# Patient Record
Sex: Female | Born: 1950 | Race: White | Hispanic: No | Marital: Married | Smoking: Former smoker
Health system: Southern US, Community
[De-identification: ages and names within clinical notes are randomized; demographics above are authoritative.]

## PROBLEM LIST (undated history)

## (undated) DIAGNOSIS — I10 Essential (primary) hypertension: Secondary | ICD-10-CM

## (undated) DIAGNOSIS — F32A Depression, unspecified: Secondary | ICD-10-CM

## (undated) DIAGNOSIS — F329 Major depressive disorder, single episode, unspecified: Secondary | ICD-10-CM

## (undated) DIAGNOSIS — M545 Low back pain, unspecified: Secondary | ICD-10-CM

## (undated) DIAGNOSIS — E785 Hyperlipidemia, unspecified: Secondary | ICD-10-CM

## (undated) HISTORY — DX: Essential (primary) hypertension: I10

## (undated) HISTORY — DX: Depression, unspecified: F32.A

## (undated) HISTORY — DX: Low back pain, unspecified: M54.50

## (undated) HISTORY — DX: Hyperlipidemia, unspecified: E78.5

## (undated) HISTORY — DX: Major depressive disorder, single episode, unspecified: F32.9

## (undated) HISTORY — DX: Low back pain: M54.5

## (undated) HISTORY — PX: BREAST BIOPSY: SHX20

---

## 1995-07-11 HISTORY — PX: LUMBAR FUSION: SHX111

## 2006-04-25 ENCOUNTER — Other Ambulatory Visit: Admission: RE | Admit: 2006-04-25 | Discharge: 2006-04-25 | Payer: Self-pay | Admitting: Obstetrics and Gynecology

## 2006-04-30 ENCOUNTER — Encounter: Payer: Self-pay | Admitting: Internal Medicine

## 2006-05-07 ENCOUNTER — Ambulatory Visit: Payer: Self-pay | Admitting: Internal Medicine

## 2006-05-28 ENCOUNTER — Ambulatory Visit: Payer: Self-pay | Admitting: Internal Medicine

## 2006-05-28 LAB — CONVERTED CEMR LAB
Basophils Relative: 0.4 % (ref 0.0–1.0)
Cholesterol: 204 mg/dL (ref 0–200)
Eosinophil percent: 7.3 % — ABNORMAL HIGH (ref 0.0–5.0)
GFR calc non Af Amer: 69 mL/min
Glomerular Filtration Rate, Af Am: 84 mL/min/{1.73_m2}
Glucose, Bld: 117 mg/dL — ABNORMAL HIGH (ref 70–99)
Hemoglobin: 14.1 g/dL (ref 12.0–15.0)
Lymphocytes Relative: 33.2 % (ref 12.0–46.0)
Monocytes Absolute: 0.3 10*3/uL (ref 0.2–0.7)
Neutro Abs: 2.3 10*3/uL (ref 1.4–7.7)
Sodium: 139 meq/L (ref 135–145)
VLDL: 20 mg/dL (ref 0–40)
WBC: 4.4 10*3/uL — ABNORMAL LOW (ref 4.5–10.5)

## 2006-06-21 ENCOUNTER — Ambulatory Visit: Payer: Self-pay | Admitting: Internal Medicine

## 2006-07-12 ENCOUNTER — Ambulatory Visit: Payer: Self-pay | Admitting: Physical Medicine & Rehabilitation

## 2006-07-12 ENCOUNTER — Encounter
Admission: RE | Admit: 2006-07-12 | Discharge: 2006-10-10 | Payer: Self-pay | Admitting: Physical Medicine & Rehabilitation

## 2006-07-27 ENCOUNTER — Ambulatory Visit: Payer: Self-pay | Admitting: Internal Medicine

## 2006-07-31 ENCOUNTER — Ambulatory Visit: Payer: Self-pay | Admitting: Internal Medicine

## 2006-08-28 ENCOUNTER — Ambulatory Visit: Payer: Self-pay | Admitting: Physical Medicine & Rehabilitation

## 2006-09-06 ENCOUNTER — Ambulatory Visit: Payer: Self-pay | Admitting: Physical Medicine and Rehabilitation

## 2006-10-10 ENCOUNTER — Encounter
Admission: RE | Admit: 2006-10-10 | Discharge: 2007-01-08 | Payer: Self-pay | Admitting: Physical Medicine & Rehabilitation

## 2006-10-18 ENCOUNTER — Encounter
Admission: RE | Admit: 2006-10-18 | Discharge: 2007-01-16 | Payer: Self-pay | Admitting: Physical Medicine & Rehabilitation

## 2006-10-23 ENCOUNTER — Ambulatory Visit: Payer: Self-pay | Admitting: Physical Medicine & Rehabilitation

## 2006-10-29 ENCOUNTER — Ambulatory Visit: Payer: Self-pay | Admitting: Internal Medicine

## 2006-10-29 LAB — CONVERTED CEMR LAB
Folate: 18.2 ng/mL
T3, Free: 2.9 pg/mL (ref 2.3–4.2)
TSH: 0.74 microintl units/mL (ref 0.35–5.50)
Vitamin B-12: 379 pg/mL (ref 211–911)

## 2006-12-31 ENCOUNTER — Ambulatory Visit: Payer: Self-pay | Admitting: Internal Medicine

## 2007-01-08 ENCOUNTER — Encounter
Admission: RE | Admit: 2007-01-08 | Discharge: 2007-04-08 | Payer: Self-pay | Admitting: Physical Medicine & Rehabilitation

## 2007-01-14 ENCOUNTER — Emergency Department (HOSPITAL_COMMUNITY): Admission: EM | Admit: 2007-01-14 | Discharge: 2007-01-14 | Payer: Self-pay | Admitting: Family Medicine

## 2007-01-15 ENCOUNTER — Ambulatory Visit: Payer: Self-pay | Admitting: Physical Medicine & Rehabilitation

## 2007-02-08 ENCOUNTER — Encounter
Admission: RE | Admit: 2007-02-08 | Discharge: 2007-05-09 | Payer: Self-pay | Admitting: Physical Medicine and Rehabilitation

## 2007-02-08 ENCOUNTER — Ambulatory Visit: Payer: Self-pay | Admitting: Physical Medicine and Rehabilitation

## 2007-03-12 ENCOUNTER — Ambulatory Visit: Payer: Self-pay | Admitting: Physical Medicine & Rehabilitation

## 2007-03-26 ENCOUNTER — Emergency Department (HOSPITAL_COMMUNITY): Admission: EM | Admit: 2007-03-26 | Discharge: 2007-03-26 | Payer: Self-pay | Admitting: Emergency Medicine

## 2007-05-06 ENCOUNTER — Encounter
Admission: RE | Admit: 2007-05-06 | Discharge: 2007-08-04 | Payer: Self-pay | Admitting: Physical Medicine & Rehabilitation

## 2007-05-06 ENCOUNTER — Ambulatory Visit: Payer: Self-pay | Admitting: Physical Medicine & Rehabilitation

## 2007-07-01 ENCOUNTER — Ambulatory Visit: Payer: Self-pay | Admitting: Physical Medicine & Rehabilitation

## 2007-07-10 ENCOUNTER — Encounter: Payer: Self-pay | Admitting: Internal Medicine

## 2007-07-10 DIAGNOSIS — F1910 Other psychoactive substance abuse, uncomplicated: Secondary | ICD-10-CM | POA: Insufficient documentation

## 2007-07-10 DIAGNOSIS — F341 Dysthymic disorder: Secondary | ICD-10-CM

## 2007-07-10 DIAGNOSIS — I10 Essential (primary) hypertension: Secondary | ICD-10-CM | POA: Insufficient documentation

## 2007-07-10 DIAGNOSIS — D649 Anemia, unspecified: Secondary | ICD-10-CM

## 2007-07-10 DIAGNOSIS — E785 Hyperlipidemia, unspecified: Secondary | ICD-10-CM

## 2007-07-10 DIAGNOSIS — N949 Unspecified condition associated with female genital organs and menstrual cycle: Secondary | ICD-10-CM

## 2007-07-25 ENCOUNTER — Ambulatory Visit: Payer: Self-pay | Admitting: Physical Medicine & Rehabilitation

## 2007-07-25 ENCOUNTER — Encounter
Admission: RE | Admit: 2007-07-25 | Discharge: 2007-10-23 | Payer: Self-pay | Admitting: Physical Medicine & Rehabilitation

## 2007-08-26 ENCOUNTER — Ambulatory Visit: Payer: Self-pay | Admitting: Physical Medicine & Rehabilitation

## 2007-10-22 ENCOUNTER — Ambulatory Visit: Payer: Self-pay | Admitting: Physical Medicine & Rehabilitation

## 2007-11-19 ENCOUNTER — Ambulatory Visit: Payer: Self-pay | Admitting: Physical Medicine & Rehabilitation

## 2007-11-19 ENCOUNTER — Encounter
Admission: RE | Admit: 2007-11-19 | Discharge: 2008-02-17 | Payer: Self-pay | Admitting: Physical Medicine & Rehabilitation

## 2007-12-12 ENCOUNTER — Emergency Department (HOSPITAL_COMMUNITY): Admission: EM | Admit: 2007-12-12 | Discharge: 2007-12-12 | Payer: Self-pay | Admitting: Emergency Medicine

## 2007-12-18 ENCOUNTER — Ambulatory Visit: Payer: Self-pay | Admitting: Physical Medicine & Rehabilitation

## 2008-01-16 ENCOUNTER — Ambulatory Visit: Payer: Self-pay | Admitting: Physical Medicine & Rehabilitation

## 2008-02-17 ENCOUNTER — Ambulatory Visit: Payer: Self-pay | Admitting: Physical Medicine & Rehabilitation

## 2008-03-13 ENCOUNTER — Encounter
Admission: RE | Admit: 2008-03-13 | Discharge: 2008-06-11 | Payer: Self-pay | Admitting: Physical Medicine & Rehabilitation

## 2008-03-13 ENCOUNTER — Ambulatory Visit (HOSPITAL_COMMUNITY)
Admission: RE | Admit: 2008-03-13 | Discharge: 2008-03-13 | Payer: Self-pay | Admitting: Physical Medicine & Rehabilitation

## 2008-03-17 ENCOUNTER — Ambulatory Visit: Payer: Self-pay | Admitting: Physical Medicine & Rehabilitation

## 2008-03-25 ENCOUNTER — Ambulatory Visit: Payer: Self-pay | Admitting: Internal Medicine

## 2008-03-25 ENCOUNTER — Encounter: Payer: Self-pay | Admitting: Internal Medicine

## 2008-03-25 DIAGNOSIS — M545 Low back pain: Secondary | ICD-10-CM

## 2008-03-25 DIAGNOSIS — F329 Major depressive disorder, single episode, unspecified: Secondary | ICD-10-CM

## 2008-04-14 ENCOUNTER — Ambulatory Visit: Payer: Self-pay | Admitting: Physical Medicine & Rehabilitation

## 2008-05-12 ENCOUNTER — Ambulatory Visit: Payer: Self-pay | Admitting: Physical Medicine & Rehabilitation

## 2008-05-26 ENCOUNTER — Ambulatory Visit: Payer: Self-pay | Admitting: Internal Medicine

## 2008-05-27 ENCOUNTER — Encounter: Payer: Self-pay | Admitting: Internal Medicine

## 2008-06-03 ENCOUNTER — Ambulatory Visit: Payer: Self-pay | Admitting: Internal Medicine

## 2008-06-08 ENCOUNTER — Encounter
Admission: RE | Admit: 2008-06-08 | Discharge: 2008-09-06 | Payer: Self-pay | Admitting: Physical Medicine & Rehabilitation

## 2008-06-09 ENCOUNTER — Ambulatory Visit: Payer: Self-pay | Admitting: Physical Medicine & Rehabilitation

## 2008-06-17 ENCOUNTER — Telehealth: Payer: Self-pay | Admitting: Internal Medicine

## 2008-06-17 ENCOUNTER — Ambulatory Visit: Payer: Self-pay | Admitting: Internal Medicine

## 2008-06-17 DIAGNOSIS — R519 Headache, unspecified: Secondary | ICD-10-CM | POA: Insufficient documentation

## 2008-06-17 DIAGNOSIS — R51 Headache: Secondary | ICD-10-CM

## 2008-06-18 ENCOUNTER — Encounter: Payer: Self-pay | Admitting: Internal Medicine

## 2008-07-07 ENCOUNTER — Ambulatory Visit: Payer: Self-pay | Admitting: Physical Medicine & Rehabilitation

## 2008-07-15 ENCOUNTER — Ambulatory Visit: Payer: Self-pay | Admitting: Internal Medicine

## 2008-08-03 ENCOUNTER — Ambulatory Visit: Payer: Self-pay | Admitting: Physical Medicine & Rehabilitation

## 2008-09-01 ENCOUNTER — Ambulatory Visit: Payer: Self-pay | Admitting: Physical Medicine & Rehabilitation

## 2008-10-07 ENCOUNTER — Encounter
Admission: RE | Admit: 2008-10-07 | Discharge: 2008-12-31 | Payer: Self-pay | Admitting: Physical Medicine & Rehabilitation

## 2008-10-13 ENCOUNTER — Ambulatory Visit: Payer: Self-pay | Admitting: Physical Medicine & Rehabilitation

## 2008-10-22 ENCOUNTER — Ambulatory Visit: Payer: Self-pay | Admitting: Internal Medicine

## 2008-11-10 ENCOUNTER — Ambulatory Visit: Payer: Self-pay | Admitting: Physical Medicine & Rehabilitation

## 2008-12-09 ENCOUNTER — Ambulatory Visit: Payer: Self-pay | Admitting: Physical Medicine & Rehabilitation

## 2008-12-31 ENCOUNTER — Encounter
Admission: RE | Admit: 2008-12-31 | Discharge: 2009-03-31 | Payer: Self-pay | Admitting: Physical Medicine & Rehabilitation

## 2009-01-06 ENCOUNTER — Ambulatory Visit: Payer: Self-pay | Admitting: Physical Medicine & Rehabilitation

## 2009-02-04 ENCOUNTER — Ambulatory Visit: Payer: Self-pay | Admitting: Physical Medicine & Rehabilitation

## 2009-03-04 ENCOUNTER — Ambulatory Visit: Payer: Self-pay | Admitting: Physical Medicine & Rehabilitation

## 2009-03-31 ENCOUNTER — Encounter
Admission: RE | Admit: 2009-03-31 | Discharge: 2009-06-23 | Payer: Self-pay | Admitting: Physical Medicine & Rehabilitation

## 2009-04-06 ENCOUNTER — Ambulatory Visit: Payer: Self-pay | Admitting: Physical Medicine & Rehabilitation

## 2009-05-04 ENCOUNTER — Ambulatory Visit: Payer: Self-pay | Admitting: Physical Medicine & Rehabilitation

## 2009-05-14 ENCOUNTER — Ambulatory Visit: Payer: Self-pay | Admitting: Internal Medicine

## 2009-06-01 ENCOUNTER — Ambulatory Visit: Payer: Self-pay | Admitting: Physical Medicine & Rehabilitation

## 2009-06-23 ENCOUNTER — Encounter
Admission: RE | Admit: 2009-06-23 | Discharge: 2009-06-30 | Payer: Self-pay | Admitting: Physical Medicine & Rehabilitation

## 2009-06-30 ENCOUNTER — Ambulatory Visit: Payer: Self-pay | Admitting: Physical Medicine & Rehabilitation

## 2009-07-14 ENCOUNTER — Ambulatory Visit: Payer: Self-pay | Admitting: Internal Medicine

## 2009-07-26 ENCOUNTER — Encounter
Admission: RE | Admit: 2009-07-26 | Discharge: 2009-10-24 | Payer: Self-pay | Admitting: Physical Medicine & Rehabilitation

## 2009-07-27 ENCOUNTER — Ambulatory Visit: Payer: Self-pay | Admitting: Physical Medicine & Rehabilitation

## 2009-08-24 ENCOUNTER — Ambulatory Visit: Payer: Self-pay | Admitting: Physical Medicine & Rehabilitation

## 2009-09-21 ENCOUNTER — Ambulatory Visit: Payer: Self-pay | Admitting: Physical Medicine & Rehabilitation

## 2009-10-19 ENCOUNTER — Ambulatory Visit: Payer: Self-pay | Admitting: Internal Medicine

## 2009-10-19 LAB — CONVERTED CEMR LAB
ALT: 24 units/L (ref 0–35)
AST: 20 units/L (ref 0–37)
Albumin: 4 g/dL (ref 3.5–5.2)
Alkaline Phosphatase: 74 units/L (ref 39–117)
BUN: 9 mg/dL (ref 6–23)
Bilirubin, Direct: 0.1 mg/dL (ref 0.0–0.3)
CO2: 32 meq/L (ref 19–32)
Calcium: 9.4 mg/dL (ref 8.4–10.5)
Chloride: 103 meq/L (ref 96–112)
Cholesterol: 206 mg/dL — ABNORMAL HIGH (ref 0–200)
Creatinine, Ser: 0.9 mg/dL (ref 0.4–1.2)
Direct LDL: 128.6 mg/dL
GFR calc non Af Amer: 68.46 mL/min (ref >60)
Glucose, Bld: 93 mg/dL (ref 70–99)
HDL: 56.4 mg/dL (ref >39.00)
Potassium: 4.8 meq/L (ref 3.5–5.1)
Sodium: 141 meq/L (ref 135–145)
TSH: 0.97 microintl units/mL (ref 0.35–5.50)
Total Bilirubin: 0.7 mg/dL (ref 0.3–1.2)
Total CHOL/HDL Ratio: 4
Total Protein: 6.9 g/dL (ref 6.0–8.3)
Triglycerides: 136 mg/dL (ref 0.0–149.0)
VLDL: 27.2 mg/dL (ref 0.0–40.0)

## 2009-10-20 ENCOUNTER — Encounter: Payer: Self-pay | Admitting: Internal Medicine

## 2009-10-20 ENCOUNTER — Ambulatory Visit: Payer: Self-pay | Admitting: Physical Medicine & Rehabilitation

## 2009-11-15 ENCOUNTER — Encounter
Admission: RE | Admit: 2009-11-15 | Discharge: 2010-02-13 | Payer: Self-pay | Admitting: Physical Medicine & Rehabilitation

## 2009-11-17 ENCOUNTER — Ambulatory Visit: Payer: Self-pay | Admitting: Physical Medicine & Rehabilitation

## 2009-12-10 ENCOUNTER — Ambulatory Visit: Payer: Self-pay | Admitting: Physical Medicine & Rehabilitation

## 2010-01-04 ENCOUNTER — Ambulatory Visit: Payer: Self-pay | Admitting: Physical Medicine & Rehabilitation

## 2010-02-02 ENCOUNTER — Ambulatory Visit: Payer: Self-pay | Admitting: Physical Medicine & Rehabilitation

## 2010-02-03 ENCOUNTER — Ambulatory Visit: Payer: Self-pay | Admitting: Internal Medicine

## 2010-02-25 ENCOUNTER — Ambulatory Visit: Payer: Self-pay | Admitting: Internal Medicine

## 2010-02-25 DIAGNOSIS — M84376A Stress fracture, unspecified foot, initial encounter for fracture: Secondary | ICD-10-CM | POA: Insufficient documentation

## 2010-02-25 DIAGNOSIS — G8929 Other chronic pain: Secondary | ICD-10-CM

## 2010-03-03 ENCOUNTER — Telehealth: Payer: Self-pay | Admitting: Internal Medicine

## 2010-03-22 ENCOUNTER — Telehealth: Payer: Self-pay | Admitting: Internal Medicine

## 2010-03-29 ENCOUNTER — Encounter: Admission: RE | Admit: 2010-03-29 | Discharge: 2010-03-29 | Payer: Self-pay | Admitting: Unknown Physician Specialty

## 2010-03-31 ENCOUNTER — Encounter: Payer: Self-pay | Admitting: Internal Medicine

## 2010-04-07 ENCOUNTER — Encounter: Payer: Self-pay | Admitting: Internal Medicine

## 2010-04-11 ENCOUNTER — Ambulatory Visit: Payer: Self-pay | Admitting: Internal Medicine

## 2010-04-28 ENCOUNTER — Encounter: Payer: Self-pay | Admitting: Internal Medicine

## 2010-05-19 ENCOUNTER — Encounter: Payer: Self-pay | Admitting: Internal Medicine

## 2010-06-09 ENCOUNTER — Encounter: Payer: Self-pay | Admitting: Internal Medicine

## 2010-07-07 ENCOUNTER — Encounter: Payer: Self-pay | Admitting: Internal Medicine

## 2010-07-31 ENCOUNTER — Encounter: Payer: Self-pay | Admitting: Obstetrics and Gynecology

## 2010-08-01 ENCOUNTER — Encounter: Payer: Self-pay | Admitting: Internal Medicine

## 2010-08-04 ENCOUNTER — Encounter: Payer: Self-pay | Admitting: Internal Medicine

## 2010-08-09 NOTE — Assessment & Plan Note (Signed)
Summary: f/u appt/#    Vital Signs:  Patient profile:   60 year old female Height:      64 inches Weight:      154 pounds BMI:     26.53 O2 Sat:      95 % on Room air Temp:     98.6 degrees F oral Pulse rate:   74 / minute Pulse rhythm:   regular Resp:     16 per minute BP sitting:   100 / 68  (left arm) Cuff size:   large  Vitals Entered By: Rock Nephew CMA (February 25, 2010 11:11 AM)  Nutrition Counseling: Patient's BMI is greater than 25 and therefore counseled on weight management options.  O2 Flow:  Room air CC: follow-up visit, Hypertension Management Is Patient Diabetic? No   Primary Care Provider:  [name] MD  CC:  follow-up visit and Hypertension Management.  History of Present Illness: She returns for f/up and requests help with pain meds., she has been discharged from Dr. Wynn Banker and is not doing well with foot pain that was found to be a stress fracture (not a Morton's neuroma) and her current regimen that was reduced by Dr. Wynn Banker is not helping to control the pain.  Hypertension History:      She denies headache, chest pain, palpitations, dyspnea with exertion, orthopnea, PND, peripheral edema, visual symptoms, neurologic problems, syncope, and side effects from treatment.  She notes no problems with any antihypertensive medication side effects.        Positive major cardiovascular risk factors include female age 65 years old or older, hyperlipidemia, and hypertension.  Negative major cardiovascular risk factors include no history of diabetes, negative family history for ischemic heart disease, and non-tobacco-user status.        Further assessment for target organ damage reveals no history of ASHD, cardiac end-organ damage (CHF/LVH), stroke/TIA, peripheral vascular disease, renal insufficiency, or hypertensive retinopathy.     Preventive Screening-Counseling & Management  Alcohol-Tobacco     Alcohol drinks/day: 0     Smoking Status: quit  Year Quit: 1991     Pack years: 15     Passive Smoke Exposure: no     Tobacco Counseling: to remain off tobacco products  Hep-HIV-STD-Contraception     Hepatitis Risk: no risk noted     HIV Risk: no     STD Risk: no risk noted      Sexual History:  currently monogamous.        Drug Use:  no.    Medications Prior to Update: 1)  Cymbalta 60 Mg  Cpep (Duloxetine Hcl) .... Take 1 Tablet By Mouth Once A Day 2)  Toprol Xl 50 Mg  Tb24 (Metoprolol Succinate) .... Take 1 Tablet By Mouth Once A Day 3)  Prempro 0.625-2.5 Mg  Tabs (Conj Estrog-Medroxyprogest Ace) .... Take 1 Tablet By Mouth Once A Day 4)  Micardis 80 Mg  Tabs (Telmisartan) .... Take 1 Tablet By Mouth Once A Day 5)  Duragesic-75 75 Mcg/hr  Pt72 (Fentanyl) .... Apply Every 72 Hours As Needed 6)  Lyrica 100 Mg  Caps (Pregabalin) .... Take 1 Tablet By Mouth Two Times A Day 7)  Oxycodone Hcl 15 Mg Tabs (Oxycodone Hcl) .... Qid  Current Medications (verified): 1)  Cymbalta 60 Mg  Cpep (Duloxetine Hcl) .... Take 1 Tablet By Mouth Once A Day 2)  Toprol Xl 50 Mg  Tb24 (Metoprolol Succinate) .... Take 1 Tablet By Mouth Once A Day 3)  Prempro 0.625-2.5 Mg  Tabs (Conj Estrog-Medroxyprogest Ace) .... Take 1 Tablet By Mouth Once A Day 4)  Micardis 80 Mg  Tabs (Telmisartan) .... Take 1 Tablet By Mouth Once A Day 5)  Duragesic-75 75 Mcg/hr  Pt72 (Fentanyl) .... Apply Every 72 Hours As Needed 6)  Lyrica 100 Mg  Caps (Pregabalin) .... Take 1 Tablet By Mouth Two Times A Day 7)  Oxycodone Hcl 15 Mg Tabs (Oxycodone Hcl) .... Qid 8)  Voltaren 1 % Gel (Diclofenac Sodium) .... 4 Grams Take Four Times A Day 9)  Amoxicillin 500 Mg Tabs (Amoxicillin) .... Take 1 Tablet By Mouth Four Times A Day 10)  Vicodin Es 7.5-750 Mg Tabs (Hydrocodone-Acetaminophen) .... Take 1 Tab Every 6hrs As Needed  Allergies (verified): No Known Drug Allergies  Past History:  Past Medical History: Last updated: 03/25/2008 Depression Hyperlipidemia Hypertension Low  back pain  Past Surgical History: Last updated: 07/10/2007 Lumbar fusion 1997 S/P Breast Biopsy   Family History: Last updated: 10/19/2009 n/a  Social History: Last updated: 10/19/2009 Married Alcohol use-no Drug use-no Regular exercise-no disabled  Risk Factors: Alcohol Use: 0 (02/25/2010) Exercise: yes (03/25/2008)  Risk Factors: Smoking Status: quit (02/25/2010) Passive Smoke Exposure: no (02/25/2010)  Family History: Reviewed history from 10/19/2009 and no changes required. n/a  Social History: Reviewed history from 10/19/2009 and no changes required. Married Alcohol use-no Drug use-no Regular exercise-no disabledSexual History:  currently monogamous  Review of Systems       The patient complains of difficulty walking.  The patient denies anorexia, fever, weight loss, weight gain, peripheral edema, abdominal pain, and depression.   MS:  Complains of joint pain, muscle aches, and stiffness; denies joint redness, joint swelling, loss of strength, and low back pain.  Physical Exam  General:  alert, well-developed, well-nourished, well-hydrated, and overweight-appearing.   Head:  normocephalic, atraumatic, no abnormalities observed, and no abnormalities palpated.   Neck:  supple, full ROM, no masses, no thyromegaly, no JVD, normal carotid upstroke, no carotid bruits, no cervical lymphadenopathy, and no neck tenderness.   Lungs:  normal respiratory effort, no intercostal retractions, no accessory muscle use, normal breath sounds, no dullness, no fremitus, no crackles, and no wheezes.   Heart:  normal rate, regular rhythm, no murmur, no gallop, no rub, and no JVD.   Abdomen:  soft, non-tender, normal bowel sounds, no distention, no masses, no guarding, no rigidity, no rebound tenderness, no abdominal hernia, no inguinal hernia, no hepatomegaly, and no splenomegaly.   Msk:  walking boot on LLE Extremities:  no edema Neurologic:  No cranial nerve deficits noted.  Station and gait are normal. Plantar reflexes are down-going bilaterally. DTRs are symmetrical throughout. Sensory, motor and coordinative functions appear intact. Skin:  Intact without suspicious lesions or rashes Cervical Nodes:  No lymphadenopathy noted Psych:  Oriented X3, memory intact for recent and remote, normally interactive, good eye contact, not anxious appearing, not agitated, not suicidal, and tearful.     Impression & Recommendations:  Problem # 1:  STRESS FRACTURE, FOOT (ICD-733.94) Assessment New Rx's for Duragesic and Oxycodone IR Orders: Pain Clinic Referral (Pain)  Problem # 2:  OTHER CHRONIC PAIN (ICD-338.29) Assessment: New  Problem # 3:  HYPERTENSION (ICD-401.9) Assessment: Improved  Her updated medication list for this problem includes:    Toprol Xl 50 Mg Tb24 (Metoprolol succinate) .Marland Kitchen... Take 1 tablet by mouth once a day    Micardis 80 Mg Tabs (Telmisartan) .Marland Kitchen... Take 1 tablet by mouth once a day  BP today:  100/68 Prior BP: 112/76 (02/03/2010)  Prior 10 Yr Risk Heart Disease: Not enough information (05/26/2008)  Labs Reviewed: K+: 4.8 (10/19/2009) Creat: : 0.9 (10/19/2009)   Chol: 206 (10/19/2009)   HDL: 56.40 (10/19/2009)   LDL: DEL (05/28/2006)   TG: 136.0 (10/19/2009)  Complete Medication List: 1)  Cymbalta 60 Mg Cpep (Duloxetine hcl) .... Take 1 tablet by mouth once a day 2)  Toprol Xl 50 Mg Tb24 (Metoprolol succinate) .... Take 1 tablet by mouth once a day 3)  Prempro 0.625-2.5 Mg Tabs (Conj estrog-medroxyprogest ace) .... Take 1 tablet by mouth once a day 4)  Micardis 80 Mg Tabs (Telmisartan) .... Take 1 tablet by mouth once a day 5)  Duragesic-75 75 Mcg/hr Pt72 (Fentanyl) .... Apply every 72 hours as needed 6)  Lyrica 100 Mg Caps (Pregabalin) .... Take 1 tablet by mouth two times a day 7)  Oxycodone Hcl 15 Mg Tabs (Oxycodone hcl) .... Qid 8)  Voltaren 1 % Gel (Diclofenac sodium) .... 4 grams take four times a day 9)  Amoxicillin 500 Mg Tabs  (Amoxicillin) .... Take 1 tablet by mouth four times a day  Hypertension Assessment/Plan:      The patient's hypertensive risk group is category B: At least one risk factor (excluding diabetes) with no target organ damage.  Today's blood pressure is 100/68.  Her blood pressure goal is < 140/90.  Patient Instructions: 1)  Please schedule a follow-up appointment in 2 months. 2)  Check your Blood Pressure regularly. If it is above 130/80: you should make an appointment. Prescriptions: OXYCODONE HCL 15 MG TABS (OXYCODONE HCL) QID  #120 x 0   Entered and Authorized by:   [name] MD   Signed by:   [name] MD on 02/25/2010   Method used:   Print then Give to Patient   RxID:   [id number] DURAGESIC-75 75 MCG/HR  PT72 (FENTANYL) apply every 72 hours as needed  #10 x 0   Entered and Authorized by:   [name] MD   Signed by:   [name] MD on 02/25/2010   Method used:   Print then Give to Patient   RxID:   [id number]

## 2010-08-09 NOTE — Letter (Signed)
Summary: [facility]  [facility]   Imported By: Sherian Rein 06/15/2010 14:47:54  _____________________________________________________________________  External Attachment:    Type:   Image     Comment:   External Document

## 2010-08-09 NOTE — Letter (Signed)
Summary: [facility]  [facility]   Imported By: Lester Normandy 05/06/2010 08:49:32  _____________________________________________________________________  External Attachment:    Type:   Image     Comment:   External Document

## 2010-08-09 NOTE — Assessment & Plan Note (Signed)
Summary: knot in mouth,sore throat/cd   Vital Signs:  Patient profile:   60 year old female Height:      64 inches Weight:      163 pounds BMI:     28.08 O2 Sat:      95 % on Room air Temp:     98.2 degrees F oral Pulse rate:   68 / minute Pulse rhythm:   regular Resp:     16 per minute BP sitting:   100 / 62  (left arm) Cuff size:   large  Vitals Entered By: Rock Nephew CMA (October 19, 2009 11:28 AM)  Nutrition Counseling: Patient's BMI is greater than 25 and therefore counseled on weight management options.  O2 Flow:  Room air CC: cholesterol and thyroid check, Hypertension Management   Primary Care Provider:  [name] MD  CC:  cholesterol and thyroid check and Hypertension Management.  History of Present Illness: She returns for f/up and she says that she saw Dr. Toney Sang, DMD yesterday and she has an infection around her left upper partial and is on PCN and needs additional dental work next work.  Today she wants to check her thyroid function and cholesterol levels.  Hypertension History:      She denies headache, chest pain, palpitations, dyspnea with exertion, orthopnea, PND, peripheral edema, visual symptoms, neurologic problems, syncope, and side effects from treatment.  She notes no problems with any antihypertensive medication side effects.        Positive major cardiovascular risk factors include female age 56 years old or older, hyperlipidemia, and hypertension.  Negative major cardiovascular risk factors include no history of diabetes, negative family history for ischemic heart disease, and non-tobacco-user status.        Further assessment for target organ damage reveals no history of ASHD, cardiac end-organ damage (CHF/LVH), stroke/TIA, peripheral vascular disease, renal insufficiency, or hypertensive retinopathy.     Current Medications (verified): 1)  Cymbalta 60 Mg  Cpep (Duloxetine Hcl) .... Take 1 Tablet By Mouth Once A Day 2)  Toprol Xl 50 Mg   Tb24 (Metoprolol Succinate) .... Take 1 Tablet By Mouth Once A Day 3)  Prempro 0.625-2.5 Mg  Tabs (Conj Estrog-Medroxyprogest Ace) .... Take 1 Tablet By Mouth Once A Day 4)  Micardis 80 Mg  Tabs (Telmisartan) .... Take 1 Tablet By Mouth Once A Day 5)  Duragesic-75 75 Mcg/hr  Pt72 (Fentanyl) .... Apply Every 72 Hours As Needed 6)  Lyrica 100 Mg  Caps (Pregabalin) .... Take 1 Tablet By Mouth Two Times A Day 7)  Oxycodone Hcl 15 Mg Tabs (Oxycodone Hcl) .... Qid 8)  Vimovo 500-20 Mg Tbec (Naproxen-Esomeprazole) .... One By Mouth Two Times A Day For Pain  Allergies (verified): No Known Drug Allergies  Past History:  Past Medical History: Reviewed history from 03/25/2008 and no changes required. Depression Hyperlipidemia Hypertension Low back pain  Past Surgical History: Reviewed history from 07/10/2007 and no changes required. Lumbar fusion 1997 S/P Breast Biopsy   Family History: Reviewed history and no changes required. n/a  Social History: Reviewed history from 03/25/2008 and no changes required. Married Alcohol use-no Drug use-no Regular exercise-no disabled  Review of Systems  The patient denies anorexia, fever, weight loss, weight gain, chest pain, abdominal pain, difficulty walking, and depression.   General:  Denies chills, fatigue, fever, loss of appetite, malaise, and sweats. MS:  Denies joint pain, joint redness, joint swelling, and muscle aches. Endo:  Denies cold intolerance, excessive hunger, excessive  thirst, excessive urination, heat intolerance, polyuria, and weight change.  Physical Exam  General:  alert, well-developed, well-nourished, well-hydrated, and overweight-appearing.   Neck:  supple, full ROM, and no masses.   Lungs:  Normal respiratory effort, chest expands symmetrically. Lungs are clear to auscultation, no crackles or wheezes. Heart:  Normal rate and regular rhythm. S1 and S2 normal without gallop, murmur, click, rub or other extra  sounds. Abdomen:  soft, non-tender, normal bowel sounds, no distention, no masses, no guarding, no hepatomegaly, and no splenomegaly.   Msk:  right foot has ttp in 2nd web space on the dorsal and plantar sides. There is deviation of the 2nd toe medially and 3rd toe laterally. normal ROM, no joint tenderness, no joint swelling, and no joint warmth.   Pulses:  R and L carotid,radial,femoral,dorsalis pedis and posterior tibial pulses are full and equal bilaterally Extremities:  No clubbing, cyanosis, edema, or deformity noted with normal full range of motion of all joints.   Neurologic:  No cranial nerve deficits noted. Station and gait are normal. Plantar reflexes are down-going bilaterally. DTRs are symmetrical throughout. Sensory, motor and coordinative functions appear intact. Skin:  Intact without suspicious lesions or rashes Cervical Nodes:  no anterior cervical adenopathy and no posterior cervical adenopathy.   Axillary Nodes:  no R axillary adenopathy and no L axillary adenopathy.   Inguinal Nodes:  no R inguinal adenopathy and no L inguinal adenopathy.   Psych:  Oriented X3, memory intact for recent and remote, normally interactive, good eye contact, not anxious appearing, and not depressed appearing.     Impression & Recommendations:  Problem # 1:  HYPERTENSION (ICD-401.9) Assessment Improved  Her updated medication list for this problem includes:    Toprol Xl 50 Mg Tb24 (Metoprolol succinate) .Marland Kitchen... Take 1 tablet by mouth once a day    Micardis 80 Mg Tabs (Telmisartan) .Marland Kitchen... Take 1 tablet by mouth once a day  Orders: Venipuncture (30160) TLB-Lipid Panel (80061-LIPID) TLB-Hepatic/Liver Function Pnl (80076-HEPATIC) TLB-TSH (Thyroid Stimulating Hormone) (84443-TSH) TLB-BMP (Basic Metabolic Panel-BMET) (80048-METABOL)  BP today: 100/62 Prior BP: 102/70 (07/14/2009)  Prior 10 Yr Risk Heart Disease: Not enough information (05/26/2008)  Labs Reviewed: K+: 4.3 (05/28/2006) Creat: :  0.9 (05/28/2006)   Chol: 204 (05/28/2006)   HDL: 55.2 (05/28/2006)   LDL: DEL (05/28/2006)   TG: 99 (05/28/2006)  Problem # 2:  HYPERLIPIDEMIA (ICD-272.4) Assessment: Unchanged  Orders: Venipuncture (10932) TLB-Lipid Panel (80061-LIPID) TLB-Hepatic/Liver Function Pnl (80076-HEPATIC) TLB-TSH (Thyroid Stimulating Hormone) (84443-TSH) TLB-BMP (Basic Metabolic Panel-BMET) (80048-METABOL)  Labs Reviewed: SGOT: 25 (05/28/2006)   SGPT: 30 (05/28/2006)  Prior 10 Yr Risk Heart Disease: Not enough information (05/26/2008)   HDL:55.2 (05/28/2006)  LDL:DEL (05/28/2006)  Chol:204 (05/28/2006)  Trig:99 (05/28/2006)  Complete Medication List: 1)  Cymbalta 60 Mg Cpep (Duloxetine hcl) .... Take 1 tablet by mouth once a day 2)  Toprol Xl 50 Mg Tb24 (Metoprolol succinate) .... Take 1 tablet by mouth once a day 3)  Prempro 0.625-2.5 Mg Tabs (Conj estrog-medroxyprogest ace) .... Take 1 tablet by mouth once a day 4)  Micardis 80 Mg Tabs (Telmisartan) .... Take 1 tablet by mouth once a day 5)  Duragesic-75 75 Mcg/hr Pt72 (Fentanyl) .... Apply every 72 hours as needed 6)  Lyrica 100 Mg Caps (Pregabalin) .... Take 1 tablet by mouth two times a day 7)  Oxycodone Hcl 15 Mg Tabs (Oxycodone hcl) .... Qid  Hypertension Assessment/Plan:      The patient's hypertensive risk group is category B: At least one  risk factor (excluding diabetes) with no target organ damage.  Today's blood pressure is 100/62.  Her blood pressure goal is < 140/90.  Patient Instructions: 1)  Please schedule a follow-up appointment in 2 months. 2)  It is important that you exercise regularly at least 20 minutes 5 times a week. If you develop chest pain, have severe difficulty breathing, or feel very tired , stop exercising immediately and seek medical attention. 3)  You need to lose weight. Consider a lower calorie diet and regular exercise.  4)  Check your Blood Pressure regularly. If it is above 140/90: you should make an appointment.

## 2010-08-09 NOTE — Letter (Signed)
Summary: [facility]  [facility]   Imported By: Sherian Rein 05/27/2010 13:14:35  _____________________________________________________________________  External Attachment:    Type:   Image     Comment:   External Document

## 2010-08-09 NOTE — Assessment & Plan Note (Signed)
Summary: FOOT PROBLEM/NWS #   Vital Signs:  Patient profile:   60 year old female Height:      64 inches Weight:      163 pounds BMI:     28.08 O2 Sat:      98 % on Room air Temp:     97.8 degrees F oral Pulse rate:   85 / minute Pulse rhythm:   regular Resp:     16 per minute BP sitting:   102 / 70  (left arm) Cuff size:   large  Vitals Entered By: Rock Nephew CMA (July 14, 2009 1:49 PM)  O2 Flow:  Room air  Primary Care Provider:  [name] MD   History of Present Illness: She continues to have pain in right foot in the 2nd web space. Vimovo helps some but she feels like 2nd and 3rd toes are "spreading apart" and turning red.  Hypertension History:      She denies headache, chest pain, palpitations, dyspnea with exertion, orthopnea, PND, peripheral edema, visual symptoms, neurologic problems, syncope, and side effects from treatment.  She notes no problems with any antihypertensive medication side effects.        Positive major cardiovascular risk factors include female age 16 years old or older, hyperlipidemia, and hypertension.  Negative major cardiovascular risk factors include no history of diabetes, negative family history for ischemic heart disease, and non-tobacco-user status.        Further assessment for target organ damage reveals no history of ASHD, cardiac end-organ damage (CHF/LVH), stroke/TIA, peripheral vascular disease, renal insufficiency, or hypertensive retinopathy.     Current Medications (verified): 1)  Cymbalta 60 Mg  Cpep (Duloxetine Hcl) .... Take 1 Tablet By Mouth Once A Day 2)  Toprol Xl 50 Mg  Tb24 (Metoprolol Succinate) .... Take 1 Tablet By Mouth Once A Day 3)  Prempro 0.625-2.5 Mg  Tabs (Conj Estrog-Medroxyprogest Ace) .... Take 1 Tablet By Mouth Once A Day 4)  Micardis 80 Mg  Tabs (Telmisartan) .... Take 1 Tablet By Mouth Once A Day 5)  Duragesic-75 75 Mcg/hr  Pt72 (Fentanyl) .... Apply Every 72 Hours As Needed 6)  Lyrica 100 Mg   Caps (Pregabalin) .... Take 1 Tablet By Mouth Two Times A Day 7)  Oxycodone Hcl 15 Mg Tabs (Oxycodone Hcl) .... Qid 8)  Vimovo 500-20 Mg Tbec (Naproxen-Esomeprazole) .... One By Mouth Two Times A Day For Pain  Allergies (verified): No Known Drug Allergies  Past History:  Past Medical History: Reviewed history from 03/25/2008 and no changes required. Depression Hyperlipidemia Hypertension Low back pain  Past Surgical History: Reviewed history from 07/10/2007 and no changes required. Lumbar fusion 1997 S/P Breast Biopsy   Social History: Reviewed history from 03/25/2008 and no changes required. Married Alcohol use-no Drug use-no Regular exercise-yes disabled  Review of Systems  The patient denies abdominal pain, suspicious skin lesions, difficulty walking, and depression.    Physical Exam  General:  alert, well-developed, well-nourished, well-hydrated, and overweight-appearing.   Neck:  supple, full ROM, and no masses.   Lungs:  Normal respiratory effort, chest expands symmetrically. Lungs are clear to auscultation, no crackles or wheezes. Heart:  Normal rate and regular rhythm. S1 and S2 normal without gallop, murmur, click, rub or other extra sounds. Abdomen:  soft, non-tender, normal bowel sounds, no distention, no masses, no guarding, no hepatomegaly, and no splenomegaly.   Msk:  right foot has ttp in 2nd web space on the dorsal and plantar sides.  There is deviation of the 2nd toe medially and 3rd toe laterally. Pulses:  R and L carotid,radial,femoral,dorsalis pedis and posterior tibial pulses are full and equal bilaterally Extremities:  No clubbing, cyanosis, edema, or deformity noted with normal full range of motion of all joints.   Neurologic:  No cranial nerve deficits noted. Station and gait are normal. Plantar reflexes are down-going bilaterally. DTRs are symmetrical throughout. Sensory, motor and coordinative functions appear intact. Skin:  turgor normal, color  normal, no rashes, no suspicious lesions, and no ecchymoses.   Cervical Nodes:  no anterior cervical adenopathy and no posterior cervical adenopathy.   Psych:  Oriented X3, memory intact for recent and remote, normally interactive, good eye contact, not anxious appearing, and not depressed appearing.     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 Take 1 tablet by mouth once a day    Micardis 80 Mg Tabs (Telmisartan) .Marland Kitchen... Take 1 tablet by mouth once a day  BP today: 102/70 Prior BP: 108/70 (05/14/2009)  Prior 10 Yr Risk Heart Disease: Not enough information (05/26/2008)  Labs Reviewed: K+: 4.3 (05/28/2006) Creat: : 0.9 (05/28/2006)   Chol: 204 (05/28/2006)   HDL: 55.2 (05/28/2006)   LDL: DEL (05/28/2006)   TG: 99 (05/28/2006)  Complete Medication List: 1)  Cymbalta 60 Mg Cpep (Duloxetine hcl) .... Take 1 tablet by mouth once a day 2)  Toprol Xl 50 Mg Tb24 (Metoprolol succinate) .... Take 1 tablet by mouth once a day 3)  Prempro 0.625-2.5 Mg Tabs (Conj estrog-medroxyprogest ace) .... Take 1 tablet by mouth once a day 4)  Micardis 80 Mg Tabs (Telmisartan) .... Take 1 tablet by mouth once a day 5)  Duragesic-75 75 Mcg/hr Pt72 (Fentanyl) .... Apply every 72 hours as needed 6)  Lyrica 100 Mg Caps (Pregabalin) .... Take 1 tablet by mouth two times a day 7)  Oxycodone Hcl 15 Mg Tabs (Oxycodone hcl) .... Qid 8)  Vimovo 500-20 Mg Tbec (Naproxen-esomeprazole) .... One by mouth two times a day for pain  Other Orders: Admin 1st Vaccine ([address]) Flu Vaccine 15yrs + ([address]454)  Hypertension Assessment/Plan:      The patient's hypertensive risk group is category B: At least one risk factor (excluding diabetes) with no target organ  damage.  Today's blood pressure is 102/70.  Her blood pressure goal is < 140/90. Flu Vaccine Consent Questions     Do you have a history of severe allergic reactions to this vaccine? no    Any prior history of allergic reactions to egg and/or gelatin? no    Do you have a sensitivity to the preservative Thimersol? no    Do you have a past history of Guillan-Barre Syndrome? no    Do you currently have an acute febrile illness? no    Have you ever had a severe reaction to latex? no    Vaccine information given and explained to patient? yes    Are you currently pregnant? no    Lot Number:[id number]   Exp Date:01/06/2010   Site Given  Left Deltoid IM Flu Vaccine 69yrs + ([address])  Patient Instructions: 1)  Please schedule a follow-up appointment in 2 months. 2)  Check your Blood Pressure regularly. If it is above 140/90: you should make an appointment. Prescriptions:  VIMOVO 500-20 MG TBEC (NAPROXEN-ESOMEPRAZOLE) One by mouth two times a day for pain  #48 x 0   Entered and Authorized by:   [name] MD   Signed by:   [name] MD on 07/14/2009   Method used:   Samples Given   RxID:   [id number]  .lbflu

## 2010-08-09 NOTE — Assessment & Plan Note (Signed)
Summary: Swollen Left Foot,toes/#cd   Vital Signs:  Patient profile:   60 year old female Height:      64 inches Weight:      155 pounds BMI:     26.70 O2 Sat:      94 % on Room air Temp:     98.4 degrees F oral Pulse rate:   88 / minute Pulse rhythm:   regular Resp:     16 per minute BP sitting:   112 / 76  (left arm) Cuff size:   large  Vitals Entered By: Rock Nephew CMA (February 03, 2010 9:37 AM)  Nutrition Counseling: Patient's BMI is greater than 25 and therefore counseled on weight management options.  O2 Flow:  Room air  Primary Care Provider:  [name] MD   History of Present Illness: She returns and informs me that she was "fired" by Dr. Jodean Lima yesterday b/c she failed a UDS- she admits to recently taking her daughter's diet pill and her housekeeper's "energy booster" as well- it sounds like she tested positive for amphetamines but that info is not available to me for certain. She is having pain in her left second MTP joint and she says that Dr. Wynn Banker told her that it was a Morton's neuroma and he  injected it about 2-3 months ago and it felt better for a while but then the pain returned. She is not getting much relief with Motrin.  Preventive Screening-Counseling & Management  Alcohol-Tobacco     Alcohol drinks/day: 0     Smoking Status: quit     Year Quit: 1991     Pack years: 15     Passive Smoke Exposure: no     Tobacco Counseling: to remain off tobacco products  Hep-HIV-STD-Contraception     Hepatitis Risk: no risk noted     HIV Risk: no     STD Risk: no risk noted      Drug Use:  no.    Current Medications (verified): 1)  Cymbalta 60 Mg  Cpep (Duloxetine Hcl) .... Take 1 Tablet By Mouth Once A Day 2)  Toprol Xl 50 Mg  Tb24 (Metoprolol Succinate) .... Take 1 Tablet By Mouth Once A Day 3)  Prempro 0.625-2.5 Mg  Tabs (Conj Estrog-Medroxyprogest Ace) .... Take 1 Tablet By Mouth Once A Day 4)  Micardis 80 Mg  Tabs (Telmisartan) .... Take 1  Tablet By Mouth Once A Day 5)  Duragesic-75 75 Mcg/hr  Pt72 (Fentanyl) .... Apply Every 72 Hours As Needed 6)  Lyrica 100 Mg  Caps (Pregabalin) .... Take 1 Tablet By Mouth Two Times A Day 7)  Oxycodone Hcl 15 Mg Tabs (Oxycodone Hcl) .... Qid  Allergies (verified): No Known Drug Allergies  Past History:  Past Medical History: Last updated: 03/25/2008 Depression Hyperlipidemia Hypertension Low back pain  Past Surgical History: Last updated: 07/10/2007 Lumbar fusion 1997 S/P Breast Biopsy   Family History: Last updated: 10/19/2009 n/a  Social History: Last updated: 10/19/2009 Married Alcohol use-no Drug use-no Regular exercise-no disabled  Risk Factors: Alcohol Use: 0 (02/03/2010) Exercise: yes (03/25/2008)  Risk Factors: Smoking Status: quit (02/03/2010) Passive Smoke Exposure: no (02/03/2010)  Family History: Reviewed history from 10/19/2009 and no changes required. n/a  Social History: Reviewed history from 10/19/2009 and no changes required. Married Alcohol use-no Drug use-no Regular exercise-no disabledHepatitis Risk:  no risk noted STD Risk:  no risk noted  Review of Systems  The patient denies anorexia, fever, chest pain, syncope, dyspnea on  exertion, peripheral edema, prolonged cough, abdominal pain, hematuria, muscle weakness, suspicious skin lesions, difficulty walking, depression, and enlarged lymph nodes.   MS:  Complains of joint pain; denies joint redness, joint swelling, loss of strength, low back pain, muscle aches, muscle, cramps, muscle weakness, and stiffness.  Physical Exam  General:  alert, well-developed, well-nourished, well-hydrated, and overweight-appearing.   Head:  normocephalic, atraumatic, no abnormalities observed, and no abnormalities palpated.   Mouth:  good dentition, no gingival abnormalities, no dental plaque, pharynx pink and moist, no erythema, no exudates, no posterior lymphoid hypertrophy, and no lesions.   Neck:   supple, full ROM, no masses, no thyromegaly, no JVD, normal carotid upstroke, no carotid bruits, no cervical lymphadenopathy, and no neck tenderness.   Lungs:  normal respiratory effort, no intercostal retractions, no accessory muscle use, normal breath sounds, no dullness, no fremitus, no crackles, and no wheezes.   Heart:  normal rate, regular rhythm, no murmur, no gallop, no rub, and no JVD.   Abdomen:  soft, non-tender, normal bowel sounds, no distention, no masses, no guarding, no rigidity, no rebound tenderness, no abdominal hernia, no inguinal hernia, no hepatomegaly, and no splenomegaly.   Msk:  she has very mild ttp over the left 2nd MTP joint but there is no mass, erythema, effusion, Pulses:  R and L carotid,radial,femoral,dorsalis pedis and posterior tibial pulses are full and equal bilaterally Extremities:  No clubbing, cyanosis, edema, or deformity noted with normal full range of motion of all joints.   Neurologic:  No cranial nerve deficits noted. Station and gait are normal. Plantar reflexes are down-going bilaterally. DTRs are symmetrical throughout. Sensory, motor and coordinative functions appear intact. Skin:  Intact without suspicious lesions or rashes Cervical Nodes:  No lymphadenopathy noted Psych:  Oriented X3, memory intact for recent and remote, normally interactive, good eye contact, not anxious appearing, not agitated, not suicidal, and tearful.     Impression & Recommendations:  Problem # 1:  MORTON'S NEUROMA, LEFT (ICD-355.6) Assessment New  Orders: Orthopedic Surgeon Referral (Ortho Surgeon)  Problem # 2:  HYPERTENSION (ICD-401.9) Assessment: Improved  Her updated medication list for this problem includes:    Toprol Xl 50 Mg Tb24 (Metoprolol succinate) .Marland Kitchen... Take 1 tablet by mouth once a day    Micardis 80 Mg Tabs (Telmisartan) .Marland Kitchen... 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 Take 1 tablet by mouth once a day 2)  Toprol Xl 50 Mg Tb24 (Metoprolol succinate) .... Take 1 tablet by mouth once a day 3)  Prempro 0.625-2.5 Mg Tabs (Conj estrog-medroxyprogest ace) .... Take 1 tablet by mouth once a day 4)  Micardis 80 Mg Tabs (Telmisartan) .... Take 1 tablet by mouth once a day 5)  Duragesic-75 75 Mcg/hr Pt72 (Fentanyl) .... Apply every 72 hours as needed 6)  Lyrica 100 Mg Caps (Pregabalin) .... Take 1 tablet by mouth two times a day 7)  Oxycodone Hcl 15 Mg Tabs (Oxycodone hcl) .... Qid  Patient Instructions: 1)  Please schedule a follow-up appointment in 1 month. 2)  Check your Blood Pressure regularly. If it  is above 130/80: you should make an appointment. 3)  It is important that you exercise regularly at least 20 minutes 5 times a week. If you develop chest pain, have severe difficulty breathing, or feel very tired , stop exercising immediately and seek medical attention. 4)  You need to lose weight. Consider a lower calorie diet and regular exercise.

## 2010-08-09 NOTE — Letter (Signed)
Summary: Lipid Letter  [facility]  [address] [place], [place] [address]   Phone: [phone]  Fax: [phone]    10/20/2009  Michelle Kirby [address] [place], [place]  [address]  Dear Michelle Kirby:  We have carefully reviewed your last lipid profile from 05/28/2006 and the results are noted below with a summary of recommendations for lipid management.    Cholesterol:       206     Goal: <200   HDL "good" Cholesterol:   57.84     Goal: >40   LDL "bad" Cholesterol:   129     Goal: <130   Triglycerides:       136.0     Goal: <150    cholesterol results are pretty good, thyroid and liver tests are normal    TLC Diet (Therapeutic Lifestyle Change): Saturated Fats & Transfatty acids should be kept < 7% of total calories ***Reduce Saturated Fats Polyunstaurated Fat can be up to 10% of total calories Monounsaturated Fat Fat can be up to 20% of total calories Total Fat should be no greater than 25-35% of total calories Carbohydrates should be 50-60% of total calories Protein should be approximately 15% of total calories Fiber should be at least 20-30 grams a day ***Increased fiber may help lower LDL Total Cholesterol should be < 200mg /day Consider adding plant stanol/sterols to diet (example: Benacol spread) ***A higher intake of unsaturated fat may reduce Triglycerides and Increase HDL    Adjunctive Measures (may lower LIPIDS and reduce risk of Heart Attack) include: Aerobic Exercise (20-30 minutes 3-4 times a week) Limit Alcohol Consumption Weight Reduction Aspirin 75-81 mg a day by mouth (if not allergic or contraindicated) Dietary Fiber 20-30 grams a day by mouth     Current Medications: 1)    Cymbalta 60 Mg  Cpep (Duloxetine hcl) .... Take 1 tablet by mouth once a day 2)    Toprol Xl 50 Mg  Tb24 (Metoprolol succinate) .... Take 1 tablet by mouth once a day 3)    Prempro 0.625-2.5 Mg  Tabs (Conj estrog-medroxyprogest ace) .... Take 1 tablet by mouth  once a day 4)    Micardis 80 Mg  Tabs (Telmisartan) .... Take 1 tablet by mouth once a day 5)    Duragesic-75 75 Mcg/hr  Pt72 (Fentanyl) .... Apply every 72 hours as needed 6)    Lyrica 100 Mg  Caps (Pregabalin) .... Take 1 tablet by mouth two times a day 7)    Oxycodone Hcl 15 Mg Tabs (Oxycodone hcl) .... Qid  If you have any questions, please call. We appreciate being able to work with you.   Sincerely,    [facility] [name] MD  Appended Document: Lipid Letter   PAP Screening:    Last PAP smear:  04/30/2006  Mammogram Screening:    Last Mammogram:  10/19/2009  Mammogram Results:    Date of Exam:  10/19/2009    Results:  Normal Bilateral  Osteoporosis Risk Assessment:  Risk Factors for Fracture or Low Bone Density:   Race (White or Asian):     yes   Smoking status:       quit  Immunization & Chemoprophylaxis:    Tetanus vaccine: Td  (04/25/2004)    Influenza vaccine: Fluvax 3+  (07/14/2009)

## 2010-08-09 NOTE — Progress Notes (Signed)
     Follow-up for Phone Call       Follow-up by: [name] MD,  March 22, 2010 11:01 AM    Additional Follow-up for Phone Call Additional follow up Details #2::    she will come to pick this up later Follow-up by: [name] MD,  March 22, 2010 11:01 AM  Additional Follow-up for Phone Call Additional follow up Details #3:: Details for Additional Follow-up Action Taken: rx placed up front.Alvy Beal Archie CMA  March 22, 2010 2:07 PM   Prescriptions: DURAGESIC-75 28 MCG/HR  PT72 [facility]) apply every 72 hours as needed  #10 x 0   Entered and Authorized by:   [name] MD   Signed by:   [name] MD on 03/22/2010   Method used:   Print then Give to Patient   RxID:   [id number]

## 2010-08-09 NOTE — Letter (Signed)
Summary: [facility]  [facility]   Imported By: Sherian Rein 04/20/2010 11:06:59  _____________________________________________________________________  External Attachment:    Type:   Image     Comment:   External Document

## 2010-08-09 NOTE — Progress Notes (Signed)
Summary: Oxycodone rx  Phone Note Call from Patient Call back at Home Phone [phone] Call back at [phone]   Summary of Call: Patient called stating that she has oxycodone from another MD and was tapering it down. She took the script she was given by Dr. Yetta Barre and the pharmacist told her that she must finish what she already had the way it was written before she can get what was given by Dr. Yetta Barre. I confirmed with the patient that the Oxycodone she has at home is 15 mg and she should take it the way Dr. Yetta Barre said until it is completely gone, then go get the new prescription given by Dr. Yetta Barre and maybe the insurance will allow it. Patient understands that she cannot have both of them filled due to same med/controlled substance. Patient will finish the 6 days or so she currently has and then fill the new prescription given by Dr. Yetta Barre. Initial call taken by: Lucious Groves CMA,  March 03, 2010 1:31 PM

## 2010-08-09 NOTE — Assessment & Plan Note (Signed)
Summary: POISON IVY/NWS   Vital Signs:  Patient profile:   60 year old female Height:      64 inches Weight:      154 pounds BMI:     26.53 O2 Sat:      97 % on Room air Temp:     98.3 degrees F oral Pulse rate:   77 / minute Pulse rhythm:   regular Resp:     16 per minute BP sitting:   112 / 78  (left arm) Cuff size:   large  Vitals Entered By: Rock Nephew CMA (April 11, 2010 10:27 AM)  Nutrition Counseling: Patient's BMI is greater than 25 and therefore counseled on weight management options.  O2 Flow:  Room air CC: Pt c/o itching and rash from posion ivy, Rash, Hypertension Management Is Patient Diabetic? No Pain Assessment Patient in pain? no       Does patient need assistance? Functional Status Self care Ambulation Normal   Primary Care Brya Simerly:  [name] MD  CC:  Pt c/o itching and rash from posion ivy, Rash, and Hypertension Management.  History of Present Illness:  Rash      This is a 60 year old woman who presents with Rash.  The symptoms began 3 days ago.  The severity is described as mild.  The patient reports macules, papules, and itching, but denies nodules, hives, welts, pustules, blisters, ulcers, scaling, weeping, oozing, redness, increased warmth, and tenderness.  The rash is located on the face, neck, and chest.  The rash is worse with heat, worse with scratching, better with topical steroids, and better with H1 blockers.  The patient denies the following symptoms: fever, headache, facial swelling, tongue swelling, burning, difficulty breathing, abdominal pain, nausea, vomiting, diarrhea, dizziness, sore throat, dysuria, eye symptoms, and arthralgias.  The patient reports a history of new topical exposure- pulling weeds and vines.  Hypertension History:      She denies headache, chest pain, palpitations, dyspnea with exertion, orthopnea, PND, peripheral edema, visual symptoms, neurologic problems, syncope, and side effects from treatment.   She notes no problems with any antihypertensive medication side effects.        Positive major cardiovascular risk factors include female age 59 years old or older, hyperlipidemia, and hypertension.  Negative major cardiovascular risk factors include no history of diabetes, negative family history for ischemic heart disease, and non-tobacco-user status.        Further assessment for target organ damage reveals no history of ASHD, cardiac end-organ damage (CHF/LVH), stroke/TIA, peripheral vascular disease, renal insufficiency, or hypertensive retinopathy.    Current Medications (verified): 1)  Cymbalta 60 Mg  Cpep (Duloxetine Hcl) .... Take 1 Tablet By Mouth Once A Day 2)  Toprol Xl 50 Mg  Tb24 (Metoprolol Succinate) .... Take 1 Tablet By Mouth Once A Day 3)  Prempro 0.625-2.5 Mg  Tabs (Conj Estrog-Medroxyprogest Ace) .... Take 1 Tablet By Mouth Once A Day 4)  Micardis 80 Mg  Tabs (Telmisartan) .... Take 1 Tablet By Mouth Once A Day 5)  Duragesic-75 75 Mcg/hr  Pt72 (Fentanyl) .... Apply Every 72 Hours As Needed 6)  Lyrica 100 Mg  Caps (Pregabalin) .... Take 1 Tablet By Mouth Two Times A Day 7)  Voltaren 1 % Gel (Diclofenac Sodium) .... 4 Grams Take Four Times A Day  Allergies (verified): No Known Drug Allergies  Past History:  Past Medical History: Last updated: 03/25/2008 Depression Hyperlipidemia Hypertension Low back pain  Past Surgical History: Last  updated: 07/10/2007 Lumbar fusion 1997 S/P Breast Biopsy   Family History: Last updated: 10/19/2009 n/a  Social History: Last updated: 10/19/2009 Married Alcohol use-no Drug use-no Regular exercise-no disabled  Risk Factors: Alcohol Use: 0 (02/25/2010) Exercise: yes (03/25/2008)  Risk Factors: Smoking Status: quit (02/25/2010) Passive Smoke Exposure: no (02/25/2010)  Family History: Reviewed history from 10/19/2009 and no changes required. n/a  Social History: Reviewed history from 10/19/2009 and no changes  required. Married Alcohol use-no Drug use-no Regular exercise-no disabled  Review of Systems  The patient denies anorexia, fever, weight loss, chest pain, syncope, dyspnea on exertion, peripheral edema, prolonged cough, headaches, hemoptysis, abdominal pain, hematuria, enlarged lymph nodes, and depression.    Physical Exam  General:  alert, well-developed, well-nourished, and well-hydrated.   Eyes:  vision grossly intact and no injection.   Ears:  R ear normal and L ear normal.   Nose:  External nasal examination shows no deformity or inflammation. Nasal mucosa are pink and moist without lesions or exudates. Mouth:  Oral mucosa and oropharynx without lesions or exudates.  Teeth in good repair. Neck:  supple, full ROM, and no masses.   Lungs:  Normal respiratory effort, chest expands symmetrically. Lungs are clear to auscultation, no crackles or wheezes. Heart:  Normal rate and regular rhythm. S1 and S2 normal without gallop, murmur, click, rub or other extra sounds. Abdomen:  soft, non-tender, normal bowel sounds, no distention, no masses, no guarding, no rigidity, no rebound tenderness, no abdominal hernia, no inguinal hernia, no hepatomegaly, and no splenomegaly.   Msk:  walking boot on LLE Pulses:  R and L carotid,radial,femoral,dorsalis pedis and posterior tibial pulses are full and equal bilaterally Extremities:  no edema Neurologic:  No cranial nerve deficits noted. Station and gait are normal. Plantar reflexes are down-going bilaterally. DTRs are symmetrical throughout. Sensory, motor and coordinative functions appear intact. Skin:  she has coalesced erythematous macules and papules on the right side of her face, onto her neck and right upper chest. these have the classic geographic pattern of linear streaks but there are no vesicles, pustules, exudates, or streaking. there is no warmth, induration, or fluctuance. Cervical Nodes:  no anterior cervical adenopathy and no posterior  cervical adenopathy.   Axillary Nodes:  no R axillary adenopathy and no L axillary adenopathy.   Inguinal Nodes:  no R inguinal adenopathy and no L inguinal adenopathy.   Psych:  Cognition and judgment appear intact. Alert and cooperative with normal attention span and concentration. No apparent delusions, illusions, hallucinations   Impression & Recommendations:  Problem # 1:  CONTACT DERMATITIS-NOS (ICD-692.9) Assessment New  will give depo-medrol IM today since she has rash on the face which is a very reactive area and needs to be treated aggressively Her updated medication list for this problem includes:    Triamcinolone Acetonide 0.5 % Crea (Triamcinolone acetonide) .Marland Kitchen... Apply to aa two times a day  Orders: Depo- Medrol 40mg  (J1030) Depo- Medrol 80mg  (J1040)  Problem # 2:  HYPERTENSION (ICD-401.9) Assessment: Improved  Her updated medication list for this problem includes:    Toprol Xl 50 Mg Tb24 (Metoprolol succinate) .Marland Kitchen... Take 1 tablet by mouth once a day    Micardis 80 Mg Tabs (Telmisartan) .Marland Kitchen... Take 1 tablet by mouth once a day  BP today: 112/78 Prior BP: 100/68 (02/25/2010)  Prior 10 Yr Risk Heart Disease: Not enough information (05/26/2008)  Labs Reviewed: K+: 4.8 (10/19/2009) Creat: : 0.9 (10/19/2009)   Chol: 206 (10/19/2009)   HDL: 56.40 (10/19/2009)  LDL: DEL (05/28/2006)   TG: 136.0 (10/19/2009)  Complete Medication List: 1)  Cymbalta 60 Mg Cpep (Duloxetine hcl) .... Take 1 tablet by mouth once a day 2)  Toprol Xl 50 Mg Tb24 (Metoprolol succinate) .... Take 1 tablet by mouth once a day 3)  Prempro 0.625-2.5 Mg Tabs (Conj estrog-medroxyprogest ace) .... Take 1 tablet by mouth once a day 4)  Micardis 80 Mg Tabs (Telmisartan) .... Take 1 tablet by mouth once a day 5)  Duragesic-75 75 Mcg/hr Pt72 (Fentanyl) .... Apply every 72 hours as needed 6)  Lyrica 100 Mg Caps (Pregabalin) .... Take 1 tablet by mouth two times a day 7)  Voltaren 1 % Gel (Diclofenac  sodium) .... 4 grams take four times a day 8)  Triamcinolone Acetonide 0.5 % Crea (Triamcinolone acetonide) .... Apply to aa two times a day  Other Orders: Flu Vaccine 101yrs + MEDICARE PATIENTS (N0272) Administration Flu vaccine - MCR (Z3664)  Hypertension Assessment/Plan:      The patient's hypertensive risk group is category B: At least one risk factor (excluding diabetes) with no target organ damage.  Today's blood pressure is 112/78.  Her blood pressure goal is < 140/90.   Patient Instructions: 1)  take zyrtec as needed for itching. 2)  Please schedule a follow-up appointment in 1 month. Prescriptions: TRIAMCINOLONE ACETONIDE 0.5 % CREA (TRIAMCINOLONE ACETONIDE) Apply to AA two times a day  #30 gms x 0   Entered and Authorized by:   [name] MD   Signed by:   [name] MD on 04/11/2010   Method used:   Electronically to        [facility]. [phone]* (retail)       [address]       [place], [place]  [address]       Ph: [phone]       Fax: [phone]   RxID:   [id number]    .[email or web address] Flu Vaccine Consent Questions     Do you have a history of severe allergic reactions to this vaccine? no    Any prior history of allergic reactions to egg and/or gelatin? no    Do you have a sensitivity to the preservative Thimersol? no    Do you have a past history of Guillan-Barre Syndrome? no    Do you currently have an acute febrile illness? no    Have you ever had a severe reaction to latex? no    Vaccine information given and explained to patient? yes    Are you currently pregnant? no    Lot Number:[id number]   Exp Date:01/07/2011   Site Given  Left Deltoid IM  Medication Administration  Injection # 1:    Medication: Depo- Medrol 40mg     Route: IM    Site: R deltoid    Exp Date: 10/2012    Lot #: [id number]    Mfr: pfizer    Patient tolerated injection without complications    Given by: Rock Nephew CMA (April 11, 2010 10:54  AM)  Injection # 2:    Medication: Depo- Medrol 80mg     Route: IM    Site: R deltoid    Exp Date: 10/2012    Lot #: [id number]    Mfr: pfizer    Patient tolerated injection without complications    Given by: Rock Nephew CMA (April 11, 2010 10:54 AM)  Orders Added: 1)  Flu Vaccine  7yrs + MEDICARE PATIENTS [Q2039] 2)  Administration Flu vaccine - MCR [G0008] 3)  Est. Patient Level IV [16109] 4)  Depo- Medrol 40mg  [J1030] 5)  Depo- Medrol 80mg  [J1040]

## 2010-08-09 NOTE — Consult Note (Signed)
Summary: [facility]   [facility]   Imported By: Lennie Odor 04/06/2010 12:19:57  _____________________________________________________________________  External Attachment:    Type:   Image     Comment:   External Document

## 2010-08-11 NOTE — Letter (Signed)
Summary: [facility]  [facility]   Imported By: Sherian Rein 07/15/2010 10:28:10  _____________________________________________________________________  External Attachment:    Type:   Image     Comment:   External Document

## 2010-08-25 NOTE — Letter (Signed)
Summary: [facility]  [facility]   Imported By: Sherian Rein 08/16/2010 07:59:09  _____________________________________________________________________  External Attachment:    Type:   Image     Comment:   External Document

## 2010-09-14 ENCOUNTER — Ambulatory Visit: Payer: Self-pay | Admitting: Internal Medicine

## 2010-09-15 NOTE — Letter (Signed)
Summary: [facility]  [facility]   Imported By: Sherian Rein 09/08/2010 14:05:58  _____________________________________________________________________  External Attachment:    Type:   Image     Comment:   External Document

## 2010-09-20 ENCOUNTER — Ambulatory Visit: Payer: Self-pay | Admitting: Internal Medicine

## 2010-09-21 ENCOUNTER — Ambulatory Visit: Payer: Self-pay | Admitting: Internal Medicine

## 2010-09-22 ENCOUNTER — Encounter: Payer: Self-pay | Admitting: Internal Medicine

## 2010-09-22 ENCOUNTER — Ambulatory Visit (INDEPENDENT_AMBULATORY_CARE_PROVIDER_SITE_OTHER): Payer: Medicare Other | Admitting: Internal Medicine

## 2010-09-22 DIAGNOSIS — I10 Essential (primary) hypertension: Secondary | ICD-10-CM

## 2010-09-22 DIAGNOSIS — R404 Transient alteration of awareness: Secondary | ICD-10-CM | POA: Insufficient documentation

## 2010-09-22 DIAGNOSIS — R0989 Other specified symptoms and signs involving the circulatory and respiratory systems: Secondary | ICD-10-CM | POA: Insufficient documentation

## 2010-09-22 DIAGNOSIS — R9431 Abnormal electrocardiogram [ECG] [EKG]: Secondary | ICD-10-CM | POA: Insufficient documentation

## 2010-09-22 DIAGNOSIS — R0609 Other forms of dyspnea: Secondary | ICD-10-CM | POA: Insufficient documentation

## 2010-09-27 NOTE — Assessment & Plan Note (Signed)
Summary: FU ---STC--pt rs'd appt/cd   Vital Signs:  Patient profile:   60 year old female Menstrual status:  postmenopausal Height:      64 inches (162.56 cm) Weight:      159.75 pounds (72.61 kg) BMI:     27.52 O2 Sat:      95 % on Room air Temp:     98.4 degrees F (36.89 degrees C) oral Pulse rate:   72 / minute Pulse rhythm:   regular Resp:     14 per minute BP sitting:   116 / 68  (left arm) Cuff size:   regular  Vitals Entered By: Burnard Leigh CMA(AAMA) (September 22, 2010 9:52 AM)  Nutrition Counseling: Patient's BMI is greater than 25 and therefore counseled on weight management options.  O2 Flow:  Room air CC: F/U visit.Pt c/o fatigue & falling asleep during the daytime when sitting/sls, cma, Fatigue Is Patient Diabetic? No Pain Assessment Patient in pain? no      Comments Pt states she is no longer taking: Toprol; Voltaren Gel Pt states that the following changes have been made: Cymbalta to 90mg  once daily; Duragesic-75 to 50/12 (patch 62 micrograms once daily).     Menstrual Status postmenopausal Last PAP Result Done   Primary Care Provider:  [name] MD  CC:  F/U visit.Pt c/o fatigue & falling asleep during the daytime when sitting/sls, cma, and Fatigue.  History of Present Illness:  Fatigue      This is a 60 year old woman who presents with Fatigue.  The symptoms began >1 year ago.  The severity is described as moderate.  The patient reports persistent fatigue, fatigue with moderate exertion, and primarily motivational fatigue, but denies fatigue with minimal exertion.  The patient also reports dyspnea.  The patient denies fever, night sweats, weight loss, exertional chest pain, cough, hemoptysis, and new medications.  The patient denies the following symptoms: leg swelling, orthopnea, PND, melena, adenopathy, severe snoring, daytime sleepiness, and skin changes.  The patient denies anhedonia, feeling depressed, altered appetite, and poor sleep.     Hypertension Follow-Up      The patient also presents for Hypertension follow-up.  The patient reports fatigue, but denies lightheadedness, urinary frequency, headaches, edema, and rash.  Associated symptoms include dyspnea.  The patient denies the following associated symptoms: chest pain, chest pressure, exercise intolerance, palpitations, syncope, leg edema, and pedal edema.  Compliance with medications (by patient report) has been near 100%.  The patient reports that dietary compliance has been good.  The patient reports exercising occasionally.  Adjunctive measures currently used by the patient include salt restriction and relaxation.    Preventive Screening-Counseling & Management  Alcohol-Tobacco     Alcohol drinks/day: 0     Alcohol Counseling: not indicated; patient does not drink     Smoking Status: quit     Year Quit: 1991     Pack years: 15     Passive Smoke Exposure: no     Tobacco Counseling: to remain off tobacco products  Hep-HIV-STD-Contraception     Hepatitis Risk: no risk noted     HIV Risk: no     STD Risk: no risk noted      Sexual History:  currently monogamous.        Drug Use:  no.    Clinical Review Panels:  Prevention   Last Mammogram:  Normal Bilateral (10/19/2009)   Last Pap Smear:  Done (04/30/2006)  Immunizations   Last Tetanus Booster:  Td (04/25/2004)   Last Flu Vaccine:  Fluvax 3+ (04/11/2010)  Lipid Management   Cholesterol:  206 (10/19/2009)   LDL (bad choesterol):  DEL (05/28/2006)   HDL (good cholesterol):  56.40 (10/19/2009)   Triglycerides:  99 (05/28/2006)  Diabetes Management   Creatinine:  0.9 (10/19/2009)   Last Flu Vaccine:  Fluvax 3+ (04/11/2010)  CBC   WBC:  4.4 (05/28/2006)   RBC:  4.78 (05/28/2006)   Hgb:  14.1 (05/28/2006)   Hct:  41.8 (05/28/2006)   Platelets:  222 (05/28/2006)   MCV  87.5 (05/28/2006)   MCHC  33.7 (05/28/2006)   RDW  12.3 (05/28/2006)   PMN:  51.2 (05/28/2006)   Lymphs:  33.2 (05/28/2006)    Monos:  7.9 (05/28/2006)   Basophil:  0.4 (05/28/2006)  Complete Metabolic Panel   Glucose:  93 (10/19/2009)   Sodium:  141 (10/19/2009)   Potassium:  4.8 (10/19/2009)   Chloride:  103 (10/19/2009)   CO2:  32 (10/19/2009)   BUN:  9 (10/19/2009)   Creatinine:  0.9 (10/19/2009)   Albumin:  4.0 (10/19/2009)   Total Protein:  6.9 (10/19/2009)   Calcium:  9.4 (10/19/2009)   Total Bili:  0.7 (10/19/2009)   Alk Phos:  74 (10/19/2009)   SGPT (ALT):  24 (10/19/2009)   SGOT (AST):  20 (10/19/2009)   Medications Prior to Update: 1)  Cymbalta 60 Mg  Cpep (Duloxetine Hcl) .... Take 1 Tablet By Mouth Once A Day 2)  Toprol Xl 50 Mg  Tb24 (Metoprolol Succinate) .... Take 1 Tablet By Mouth Once A Day 3)  Prempro 0.625-2.5 Mg  Tabs (Conj Estrog-Medroxyprogest Ace) .... Take 1 Tablet By Mouth Once A Day 4)  Micardis 80 Mg  Tabs (Telmisartan) .... Take 1 Tablet By Mouth Once A Day 5)  Duragesic-75 75 Mcg/hr  Pt72 (Fentanyl) .... Apply Every 72 Hours As Needed 6)  Lyrica 100 Mg  Caps (Pregabalin) .... Take 1 Tablet By Mouth Two Times A Day 7)  Voltaren 1 % Gel (Diclofenac Sodium) .... 4 Grams Take Four Times A Day 8)  Triamcinolone Acetonide 0.5 % Crea (Triamcinolone Acetonide) .... Apply To Aa Two Times A Day  Current Medications (verified): 1)  Cymbalta 60 Mg  Cpep (Duloxetine Hcl) .... Take 1 Tablet By Mouth Once A Day 2)  Prempro 0.625-2.5 Mg  Tabs (Conj Estrog-Medroxyprogest Ace) .... Take 1 Tablet By Mouth Once A Day 3)  Micardis 80 Mg  Tabs (Telmisartan) .... Take 1 Tablet By Mouth Once A Day 4)  Duragesic-75 75 Mcg/hr  Pt72 (Fentanyl) .... Apply Every 72 Hours As Needed 5)  Lyrica 100 Mg  Caps (Pregabalin) .... Take 1 Tablet By Mouth Two Times A Day 6)  Voltaren 1 % Gel (Diclofenac Sodium) .... 4 Grams Take Four Times A Day 7)  Triamcinolone Acetonide 0.5 % Crea (Triamcinolone Acetonide) .... Apply To Aa Two Times A Day As Needed 8)  Nuvigil 150 Mg Tabs (Armodafinil) .... One By Mouth Once  Daily For Sleepiness  Allergies (verified): No Known Drug Allergies  Past History:  Past Medical History: Last updated: 03/25/2008 Depression Hyperlipidemia Hypertension Low back pain  Past Surgical History: Last updated: 07/10/2007 Lumbar fusion 1997 S/P Breast Biopsy   Family History: Last updated: 10/19/2009 n/a  Social History: Last updated: 10/19/2009 Married Alcohol use-no Drug use-no Regular exercise-no disabled  Risk Factors: Alcohol Use: 0 (09/22/2010) Exercise: yes (03/25/2008)  Risk Factors: Smoking Status: quit (09/22/2010) Passive Smoke Exposure: no (09/22/2010)  Family History: Reviewed history  from 10/19/2009 and no changes required. n/a  Social History: Reviewed history from 10/19/2009 and no changes required. Married Alcohol use-no Drug use-no Regular exercise-no disabled  Review of Systems       The patient complains of weight gain and dyspnea on exertion.  The patient denies anorexia, fever, weight loss, chest pain, syncope, peripheral edema, prolonged cough, headaches, hemoptysis, abdominal pain, hematuria, muscle weakness, suspicious skin lesions, difficulty walking, depression, abnormal bleeding, enlarged lymph nodes, and angioedema.   CV:  Complains of leg cramps with exertion and shortness of breath with exertion; denies bluish discoloration of lips or nails, chest pain or discomfort, difficulty breathing at night, difficulty breathing while lying down, fainting, lightheadness, near fainting, palpitations, swelling of feet, swelling of hands, and weight gain. Resp:  Complains of hypersomnolence; denies cough, excessive snoring, morning headaches, pleuritic, sputum productive, and wheezing.  Physical Exam  General:  alert, well-developed, well-nourished, and well-hydrated.   Head:  normocephalic, atraumatic, no abnormalities observed, and no abnormalities palpated.   Mouth:  Oral mucosa and oropharynx without lesions or exudates.   Teeth in good repair. Neck:  supple, full ROM, and no masses.   Lungs:  Normal respiratory effort, chest expands symmetrically. Lungs are clear to auscultation, no crackles or wheezes. Heart:  Normal rate and regular rhythm. S1 and S2 normal without gallop, murmur, click, rub or other extra sounds. Abdomen:  soft, non-tender, normal bowel sounds, no distention, no masses, no guarding, no rigidity, no rebound tenderness, no abdominal hernia, no inguinal hernia, no hepatomegaly, and no splenomegaly.   Msk:  normal ROM, no joint tenderness, no joint swelling, no joint warmth, no redness over joints, no joint deformities, no joint instability, and no crepitation.   Pulses:  R and L carotid,radial,femoral,dorsalis pedis and posterior tibial pulses are full and equal bilaterally Extremities:  No clubbing, cyanosis, edema, or deformity noted with normal full range of motion of all joints.   Neurologic:  No cranial nerve deficits noted. Station and gait are normal. Plantar reflexes are down-going bilaterally. DTRs are symmetrical throughout. Sensory, motor and coordinative functions appear intact. Skin:  Intact without suspicious lesions or rashes Cervical Nodes:  No lymphadenopathy noted Psych:  Cognition and judgment appear intact. Alert and cooperative with normal attention span and concentration. No apparent delusions, illusions, hallucinations Additional Exam:  Her EKG shows a lot of artifact from her e-stimulator in her lower back and some LAE but she does not have LVH, Q waves, or acute ST/T wave changes   Impression & Recommendations:  Problem # 1:  SLEEPINESS (ICD-780.09) Assessment New will try nuvigil  Problem # 2:  ABNORMAL ELECTROCARDIOGRAM (ICD-794.31) Assessment: New  Orders: Echo Referral (Echo)  Problem # 3:  DYSPNEA ON EXERTION (ICD-786.09) Assessment: New will get an ECHO done to look for valvular disease, etc. The following medications were removed from the medication  list:    Toprol Xl 50 Mg Tb24 (Metoprolol succinate) .Marland Kitchen... Take 1 tablet by mouth once a day  Orders: EKG w/ Interpretation (93000) Echo Referral (Echo)  Problem # 4:  HYPERTENSION (ICD-401.9) Assessment: Improved  The following medications were removed from the medication list:    Toprol Xl 50 Mg Tb24 (Metoprolol succinate) .Marland Kitchen... Take 1 tablet by mouth once a day Her updated medication list for this problem includes:    Micardis 80 Mg Tabs (Telmisartan) .Marland Kitchen... Take 1 tablet by mouth once a day  BP today: 116/68 Prior BP: 112/78 (04/11/2010)  Prior 10 Yr Risk Heart Disease: Not enough information (  05/26/2008)  Labs Reviewed: K+: 4.8 (10/19/2009) Creat: : 0.9 (10/19/2009)   Chol: 206 (10/19/2009)   HDL: 56.40 (10/19/2009)   LDL: DEL (05/28/2006)   TG: 136.0 (10/19/2009)  Problem # 5:  ANEMIA (ICD-285.9) Assessment: Unchanged  no labs were done today at her request  Hgb: 14.1 (05/28/2006)   Hct: 41.8 (05/28/2006)   Platelets: 222 (05/28/2006) RBC: 4.78 (05/28/2006)   RDW: 12.3 (05/28/2006)   WBC: 4.4 (05/28/2006) MCV: 87.5 (05/28/2006)   MCHC: 33.7 (05/28/2006) B12: 379 (10/29/2006)   Folate: 18.2 (10/29/2006)   TSH: 0.97 (10/19/2009)  Complete Medication List: 1)  Cymbalta 60 Mg Cpep (Duloxetine hcl) .... Take 1 tablet by mouth once a day 2)  Prempro 0.625-2.5 Mg Tabs (Conj estrog-medroxyprogest ace) .... Take 1 tablet by mouth once a day 3)  Micardis 80 Mg Tabs (Telmisartan) .... Take 1 tablet by mouth once a day 4)  Duragesic-75 75 Mcg/hr Pt72 (Fentanyl) .... Apply every 72 hours as needed 5)  Lyrica 100 Mg Caps (Pregabalin) .... Take 1 tablet by mouth two times a day 6)  Voltaren 1 % Gel (Diclofenac sodium) .... 4 grams take four times a day 7)  Triamcinolone Acetonide 0.5 % Crea (Triamcinolone acetonide) .... Apply to aa two times a day as needed 8)  Nuvigil 150 Mg Tabs (Armodafinil) .... One by mouth once daily for sleepiness  Patient Instructions: 1)  Please schedule  a follow-up appointment in 1 month. 2)  It is important that you exercise regularly at least 20 minutes 5 times a week. If you develop chest pain, have severe difficulty breathing, or feel very tired , stop exercising immediately and seek medical attention. 3)  You need to lose weight. Consider a lower calorie diet and regular exercise.  4)  Check your Blood Pressure regularly. If it is above 130/80: you should make an appointment. Prescriptions: NUVIGIL 150 MG TABS (ARMODAFINIL) One by mouth once daily for sleepiness  #21 x 0   Entered and Authorized by:   [name] MD   Signed by:   [name] MD on 09/22/2010   Method used:   Samples Given   RxID:   [id number]    Orders Added: 1)  EKG w/ Interpretation [93000] 2)  Echo Referral [Echo] 3)  Est. Patient Level IV [14782]

## 2010-10-03 ENCOUNTER — Other Ambulatory Visit (HOSPITAL_COMMUNITY): Payer: Self-pay | Admitting: Internal Medicine

## 2010-10-03 DIAGNOSIS — R9431 Abnormal electrocardiogram [ECG] [EKG]: Secondary | ICD-10-CM

## 2010-10-04 ENCOUNTER — Other Ambulatory Visit: Payer: Self-pay | Admitting: Internal Medicine

## 2010-10-04 ENCOUNTER — Ambulatory Visit (HOSPITAL_COMMUNITY): Payer: Medicare Other | Attending: Internal Medicine | Admitting: Radiology

## 2010-10-04 DIAGNOSIS — E785 Hyperlipidemia, unspecified: Secondary | ICD-10-CM | POA: Insufficient documentation

## 2010-10-04 DIAGNOSIS — R9431 Abnormal electrocardiogram [ECG] [EKG]: Secondary | ICD-10-CM | POA: Insufficient documentation

## 2010-10-04 DIAGNOSIS — I10 Essential (primary) hypertension: Secondary | ICD-10-CM | POA: Insufficient documentation

## 2010-10-12 ENCOUNTER — Telehealth: Payer: Self-pay | Admitting: *Deleted

## 2010-10-12 MED ORDER — ARMODAFINIL 150 MG PO TABS: 150.0000 mg | ORAL_TABLET | Freq: Every day | ORAL | Status: DC

## 2010-10-12 NOTE — Telephone Encounter (Signed)
Patient requesting rx for nuvigil 150mg  1 qd. (Was given samples at last ov) Ok for rx?

## 2010-10-12 NOTE — Telephone Encounter (Signed)
Left vm for pt to check w/her pharm 

## 2010-10-12 NOTE — Telephone Encounter (Signed)
yes

## 2010-10-13 ENCOUNTER — Telehealth: Payer: Self-pay | Admitting: *Deleted

## 2010-10-13 MED ORDER — MODAFINIL 100 MG PO TABS: 100.0000 mg | ORAL_TABLET | Freq: Every day | ORAL | Status: DC

## 2010-10-13 NOTE — Telephone Encounter (Signed)
Nuvigil req PA - Has patient tried & failed provigil? Or any other meds?

## 2010-10-13 NOTE — Telephone Encounter (Signed)
Done, pls call it in

## 2010-10-13 NOTE — Telephone Encounter (Signed)
Med will not be approved, would you like to prescribe alternative?

## 2010-10-13 NOTE — Telephone Encounter (Signed)
no

## 2010-10-13 NOTE — Telephone Encounter (Signed)
Called in Rx, pt aware, left her vm

## 2010-10-17 ENCOUNTER — Ambulatory Visit: Payer: BC Managed Care – PPO | Admitting: Internal Medicine

## 2010-10-18 ENCOUNTER — Telehealth: Payer: Self-pay | Admitting: Internal Medicine

## 2010-10-18 DIAGNOSIS — R404 Transient alteration of awareness: Secondary | ICD-10-CM

## 2010-10-18 NOTE — Telephone Encounter (Signed)
PA requested for Provigil 100mg  tablets 10/14/10 PA form requested & received from [employer] 10/17/10 PA  Form forwarded to MD for completion 10/17/10 PA form received from MD & faxed to PS for approval

## 2010-10-19 NOTE — Telephone Encounter (Signed)
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 Please Advise.

## 2010-10-19 NOTE — Telephone Encounter (Signed)
A sleep study is feasible, I will order it for her

## 2010-10-19 NOTE — Telephone Encounter (Signed)
Received 2nd fax requesting additional information for PA; forwarded to MD for completion 10/18/10 Faxed New PA form to PS @[phone] for approval.

## 2010-10-20 ENCOUNTER — Encounter: Payer: Self-pay | Admitting: Internal Medicine

## 2010-10-20 ENCOUNTER — Ambulatory Visit (INDEPENDENT_AMBULATORY_CARE_PROVIDER_SITE_OTHER): Payer: BC Managed Care – PPO | Admitting: Internal Medicine

## 2010-10-20 DIAGNOSIS — G8929 Other chronic pain: Secondary | ICD-10-CM

## 2010-10-20 DIAGNOSIS — R404 Transient alteration of awareness: Secondary | ICD-10-CM

## 2010-10-20 DIAGNOSIS — E785 Hyperlipidemia, unspecified: Secondary | ICD-10-CM

## 2010-10-20 DIAGNOSIS — I10 Essential (primary) hypertension: Secondary | ICD-10-CM

## 2010-10-20 DIAGNOSIS — F329 Major depressive disorder, single episode, unspecified: Secondary | ICD-10-CM

## 2010-10-20 DIAGNOSIS — R9431 Abnormal electrocardiogram [ECG] [EKG]: Secondary | ICD-10-CM

## 2010-10-20 NOTE — Assessment & Plan Note (Signed)
She is pain free with her current treatment at [facility]

## 2010-10-20 NOTE — Assessment & Plan Note (Signed)
She has been referred for sleep evaluation, she is doing very well on Nuvigil and she would like to continue taking it

## 2010-10-20 NOTE — Assessment & Plan Note (Signed)
Her BP is well controlled 

## 2010-10-20 NOTE — Progress Notes (Signed)
Subjective:    Patient ID: Michelle Kirby, female    DOB: Dec 23, 1950, 60 y.o.   MRN: [id number]  Hypertension This is a chronic problem. The current episode started more than 1 year ago. The problem has been gradually improving since onset. The problem is controlled. Pertinent negatives include no anxiety, blurred vision, chest pain, headaches, malaise/fatigue, neck pain, orthopnea, palpitations, peripheral edema, PND, shortness of breath or sweats. There are no associated agents to hypertension. Risk factors for coronary artery disease include no known risk factors. Past treatments include angiotensin blockers. The current treatment provides moderate improvement. There are no compliance problems.       Review of Systems  Constitutional: Positive for fatigue (this is much better since she started nuvigil). Negative for fever, chills, malaise/fatigue, diaphoresis, activity change, appetite change and unexpected weight change.  HENT: Negative for ear pain, congestion, facial swelling, rhinorrhea, sneezing, neck pain, neck stiffness, postnasal drip and tinnitus.   Eyes: Negative for blurred vision.  Respiratory: Negative for apnea, cough, choking, chest tightness, shortness of breath, wheezing and stridor.   Cardiovascular: Negative for chest pain, palpitations, orthopnea, leg swelling and PND.  Gastrointestinal: Negative for nausea, abdominal pain, diarrhea, constipation, abdominal distention and anal bleeding.  Genitourinary: Negative for dysuria, urgency, hematuria and difficulty urinating.  Musculoskeletal: Negative for myalgias, joint swelling, arthralgias and gait problem.  Skin: Negative for color change, pallor, rash and wound.  Neurological: Negative for dizziness, tremors, seizures, syncope, facial asymmetry, speech difficulty, weakness, light-headedness, numbness and headaches.  Hematological: Negative for adenopathy. Does not bruise/bleed easily.  Psychiatric/Behavioral: Negative for  suicidal ideas, hallucinations, behavioral problems, confusion, sleep disturbance, self-injury, dysphoric mood, decreased concentration and agitation. The patient is not nervous/anxious and is not hyperactive.        Objective:   Physical Exam  [nursing notereviewed. Constitutional: She is oriented to person, place, and time. She appears well-developed and well-nourished. No distress.  HENT:  Head: Normocephalic and atraumatic.  Right Ear: External ear normal.  Left Ear: External ear normal.  Nose: Nose normal.  Mouth/Throat: Oropharynx is clear and moist. No oropharyngeal exudate.  Eyes: Conjunctivae and EOM are normal. Pupils are equal, round, and reactive to light. Right eye exhibits no discharge. Left eye exhibits no discharge. No scleral icterus.  Neck: Normal range of motion. Neck supple. No JVD present. No thyromegaly present.  Cardiovascular: Normal rate, regular rhythm, normal heart sounds and intact distal pulses.  Exam reveals no gallop and no friction rub.   No murmur heard. Pulmonary/Chest: Effort normal and breath sounds normal. No respiratory distress. She has no wheezes. She has no rales. She exhibits no tenderness.  Abdominal: Soft. Bowel sounds are normal. She exhibits no distension and no mass. There is no tenderness. There is no rebound and no guarding.  Musculoskeletal: Normal range of motion. She exhibits no edema and no tenderness.  Lymphadenopathy:    She has no cervical adenopathy.  Neurological: She is alert and oriented to person, place, and time. She has normal reflexes. She displays normal reflexes. No cranial nerve deficit. She exhibits normal muscle tone. Coordination normal.  Skin: Skin is warm and dry. No rash noted. She is not diaphoretic. No erythema. No pallor.  Psychiatric: She has a normal mood and affect. Her behavior is normal. Judgment and thought content normal.       Lab Results  Component Value Date   WBC 4.4* 05/28/2006   HGB 14.1  05/28/2006   HCT 41.8 05/28/2006   PLT 222 05/28/2006  CHOL 206* 10/19/2009   TRIG 136.0 10/19/2009   HDL 56.40 10/19/2009   LDLDIRECT 128.6 10/19/2009   ALT 24 10/19/2009   AST 20 10/19/2009   NA 141 10/19/2009   K 4.8 10/19/2009   CL 103 10/19/2009   CREATININE 0.9 10/19/2009   BUN 9 10/19/2009   CO2 32 10/19/2009   TSH 0.97 10/19/2009      Assessment & Plan:

## 2010-10-20 NOTE — Assessment & Plan Note (Signed)
She is doing well on cymbalta 

## 2010-10-20 NOTE — Assessment & Plan Note (Signed)
Her ECHO showed mild diastolic dysfunction but she has no s/s and no evidence of fluid overload so I will continue to treat the hypertension and follow from there

## 2010-10-20 NOTE — Patient Instructions (Signed)

## 2010-11-22 ENCOUNTER — Encounter (HOSPITAL_BASED_OUTPATIENT_CLINIC_OR_DEPARTMENT_OTHER): Payer: BC Managed Care – PPO

## 2010-11-22 NOTE — Assessment & Plan Note (Signed)
SUBJECTIVE:  The patient returns today. I last saw her on 8/08, nursing  visit 02/13/07. She has a history of lumbar post laminectomy syndrome,  chronic radiculitis and has been relatively well managed with the  radicular discomfort with the increase of the Lyrica back up to 150  b.i.d.  she has been increasing her level of activities, and in fact, is  doing some painting and other household maintenance. With this increased  activity she notices that her pain on the third of the patch seems to  increase despite being on the breakthrough oxycodone 15 mg q. 6 p.r.n.  No new issues other than above.   She has average pain score of 6 to 7 increasing with activity to 8.  Sleep is good. She can walk 45 minutes at a time. She drives. She has  some constipation.   PHYSICAL EXAMINATION:  VITAL SIGNS: Blood pressure 105/62, pulse 69,  respiratory rate is 18, 02 sat 99% on room air.  GENERAL: No acute distress. Mood and affect appropriate.  BACK: Her back has no tenderness to palpation. She has a good spinal  range of motion.  EXTREMITIES: Upper and lower extremity strength and range of motion are  good. Deep tendon reflexes are normal.   IMPRESSION:  Lumbar post laminectomy syndrome as well as chronic  radiculitis using spinal cord stimulator in combination with Lyrica for  the radicular pain, Duragesic patch mainly for the back pain as well as  the oxycodone for the breakthrough.   PLAN:  I will see her back in 2 months. She has finished physical  therapy program. Will continue home exercise program. In regards to her  constipation, have written down Senokot 1 tablet every day or every  other day. We can increase to 2 if we need.  May need to add Colace as  well.      Erick Colace, M.D.  Electronically Signed     AEK/MedQ  D:  03/12/2007 10:07:02  T:  03/12/2007 11:11:25  Job #:  [id number]

## 2010-11-22 NOTE — Assessment & Plan Note (Signed)
PRIMARY CARE PHYSICIAN:  Barbette Hair. Artist Pais, DO   Ms. Matsuo return today.  I last saw her on January 16, 2008.  She has  not had great toe x-ray yet.  She has complaints on the left side.  She  plans to get an x-ray done this month.  She does continue to have pain  and sometimes has to keep the toe out from underneath the sheets because  it is hypersensitive over that area.  No other joint flare-ups.   CURRENT MEDICATIONS:  1. Duragesic patch 75 mcg q.48 hours.  2. Lyrica 150 mg p.o. b.i.d.  3. Oxycodone 15 mg q.i.d.   Current pain level 7/10.  Sleep is fair.  Pain increases with activity.  She can walk 30 minutes at a time.  She drives.   REVIEW OF SYSTEMS:  She has had some weight gain.   FAMILY HISTORY:  Heart disease and high blood pressure.   MEDICATIONS:  Her pill counts are as follows:  1. Oxycodone 15 mg #1.  2. Duragesic applied last patch 2 days ago.  3. She still has Lyrica and Cymbalta refills.   PHYSICAL EXAMINATION:  VITAL SIGNS:  Her blood pressure is 130/78, pulse  80, respirations 18, and O2 sat 98% on room air.  GENERAL:  In no acute distress.  Orientation x3.  Affect is alert.  Gait  is normal.  EXTREMITIES:  She does have some tenderness over the first MTP bunion  forming on the medial aspect on the left first MTP.  She has reduced  range of motion, which she states she has had since her lumbar  laminectomy and chronic radiculopathy.  She has good pulses, dorsalis  pedis and posterior tibial.  She has normal joint range of motion in the  ankle, knees, and hips.  No upper extremity joint swelling noted.  MUSCULOSKELETAL:  Her spine has no tenderness to palpation.  She has  good spinal range of motion.   IMPRESSION:  1. Lumbar post laminectomy syndrome.  Chronic radicular pain and      weakness.  She is getting benefit from a combination of the current      medications Duragesic 75 mcg q.48 hours, Lyrica 150 mg p.o. b.i.d.,      oxycodone 15 mg p.o. q.i.d., and  Cymbalta 60 mg per day.   1. Left first metatarsophalangeal pain.  We will x-ray.  She had some      trauma to it last year but I am more suspicious that it may be      gout.  We will first look whether she has a chronic nonhealed      fracture, but if negative, would aspirate and send fluid for      crystals.      Erick Colace, M.D.  Electronically Signed     AEK/MedQ  D:  02/17/2008 09:58:36  T:  02/17/2008 22:24:04  Job #:  [id number]   cc:   Barbette Hair. [place], DO  [address] [place], [place] [address]

## 2010-11-22 NOTE — Assessment & Plan Note (Signed)
Ms. Hazell follows up today.  I last saw her on March 17, 2008.  She is waiting to get in with Dr. Lestine Box.  She should get in there in  the beginning of November.  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   Her pain score is fairly stable compared with last time 6-7.  It  interferes with activity at 7-9/10 level.  Sleep is good.  Pain improves  with medication.  She can walk 30 minutes at a time.  She has some  difficulty with certain household duties.  Her Oswestry disability index  was completed today.  Her score today is 44%, which is stable compared  to the 51% last visit.   SOCIAL HISTORY:  Married.  No changes.  Continues to care for her young  children.   PHYSICAL EXAMINATION:  VITAL SIGNS:  Blood pressure 130/81, pulse 87,  respiratory rate 18, and O2 sat 98% on room air.  GENERAL:  In no acute distress.  Orientation x3.  Affect is alert.  Gait  is normal.  EXTREMITIES:  Without edema.  Back range of motion is good.  Lower  extremity strength is good.   IMPRESSION:  1. Lumbar post-laminectomy syndrome, chronic postoperative pain.  We      will continue Duragesic patch 75 mcg q.48 h. and oxycodone 50 mg      q.i.d. as well as Lyrica 150 b.i.d.  She has a spinal cord      stimulator that she uses as well, which gives her partial relief.  2. Left first metatarsophalangeal joint osteoarthritis.  Referal to      Dr. Lestine Box.   I will see her back in approximately 1 month.      Erick Colace, M.D.  Electronically Signed     AEK/MedQ  D:  04/14/2008 10:59:00  T:  04/15/2008 01:25:10  Job #:  [id number]   cc:   Leonides Grills, M.D.  Fax: [phone]

## 2010-11-22 NOTE — Assessment & Plan Note (Signed)
Michelle Kirby returns today after I last saw her on July 02, 2007.  She had a nursing visit on July 31, 2007.  She has had no new medical  issues.  She has just had a flare-up of her pain over the last month.  This is mainly in the left lower extremity.  She continues to use the  spinal cord stimulator, has this on about 24 hours a day.  Keeps it on a  higher setting during the day and lower setting in the evening.   Her average pain is about 6/10, interferes with activities in the 8-9/10  range.  Sleep is fair.  Pain is worse with bending.  She can walk 45  minutes at a time.  She does not climb steps.  She drives.  She has some  difficulty with certain household duties, but otherwise independent with  all other self care.  She does walk her dog outside.  She has had some  weight gain, some night sweats.   CURRENT PAIN MEDICATIONS:  1. Duragesic patch 75 mcg, change q. 48 hours.  2. Lyrica 150 p.o. b.i.d.  3. Oxycodone 15 mg p.o. q.i.d. for breakthrough pain.   EXAMINATION:  GENERAL:  In no acute distress.  Mood and affect  appropriate.  Her gait is normal.  She has normal spine range of mitosin, normal neck range of motion.  Upper and lower extremity strength are normal.  Blood pressure 115/80, pulse 79, respiratory rate 16, O2 saturation 98%  room air.   IMPRESSION:  1. Chronic lumbar post laminectomy syndrome with chronic radiculitis      left lower extremity.  2. Groin pain intermittent.  This may be something to do with her      stimulator settings.  Her hip exam is normal and pain doe snot      increase with ambulation.   PLAN:  1. Continue Fentanyl patch 75 mcg q. 40 hours.  2. Continue oxycodone 15 mg q.i.d.  3. Continue Lyrica 150 b.i.d.  4. I will see her back in two to three months with nursing visits in      between.      Erick Colace, M.D.  Electronically Signed     AEK/MedQ  D:  08/28/2007 10:39:42  T:  08/28/2007 18:48:45  Job #:   [id number]

## 2010-11-22 NOTE — Assessment & Plan Note (Signed)
A 60 year old female with lumbar post laminectomy syndrome, chronic  postoperative pain.  She also has a spinal cord stimulator implanted,  which she still uses.  She is scheduled for bunionectomy tomorrow.  She  has been weaning oxycodone to help repair for postoperative pain  management.   Her current meds include,  1. Duragesic patch 75 mcg q.48 h.  2. Lyrica 150 mg p.o. b.i.d.  3. Cymbalta 60 mg p.o. daily.  4. Oxycodone usual dose is 50 mg q.i.d., current dose is 15 mg b.i.d.   Her pain level is 8-9.  She could not tell the difference in her left  lower extremity pain on the lower dose oxycodone.  Her walking tolerance  is 10 minutes.  She drives.  She has some difficulty with certain  household duties and shopping.   Her review of systems is otherwise negative.   PAST HISTORY:  Significant for hypertension.   SOCIAL HISTORY:  Married, cares for 4 children.   PHYSICAL EXAMINATION:  VITAL SIGNS:  Blood pressure 132/81, pulse 96,  respirations 18, O2 sat 98% on room air.  GENERAL:  Mildly anxious female in no acute distress.  Orientation x3.  Gait is normal.  BACK:  Mild tenderness to palpation in the lumbosacral junction.  She  has good range of motion, forward flexion, extension, lateral rotation,  and bending.  She has normal deep tendon reflex bilateral lower  extremities strength is normal.   IMPRESSION:  Lumbar post laminectomy syndrome, chronic radicular pain  left lower extremity.   PLAN:  1. We will have her resume the q.i.d. dosing of the oxycodone      tomorrow.  2. She will continue Duragesic patch.  3. Written new prescriptions for Lyrica 150 b.i.d. and Cymbalta 60 mg      per day.   Because she has reduced her oxycodone dosage, she still has 46 tablets  left, which is roughly a 10 or 11-day supply.  We will write for the  next prescription not to be filled prior to August 13, 2008.      Erick Colace, M.D.  Electronically Signed     AEK/MedQ  D:  08/03/2008 14:29:23  T:  08/04/2008 03:38:37  Job #:  [id number]   cc:   Leonides Grills, M.D.  Fax: [phone]   Sanda Linger, MD  [address] [place] [address] [place] [address]

## 2010-11-22 NOTE — Assessment & Plan Note (Signed)
HISTORY:  [name] is a 60 year old female with lumbar post-  laminectomy syndrome and chronic postoperative pain.  She has undergone  a spinal cord stimulator placement which is functioning well five years  post.  She is using it continuously during the daytime hours.  She has  had a lot of fatigue.  She is falling asleep at theaters, when she takes  her grandchildren to movies.  She has had sleep studies in the past,  which have been reportedly normal.  She has had thyroid function tests  in the past, but none for awhile now.  She has not had any recent blood  work from her primary care physician.   Her pain level is about 8/10.  Most of it is her burning pain in her  legs and not so much her back pain.  She can walk an hour at a time.  She does about two hours a day in the evening.  Her pain does interfere  with household duties.   REVIEW OF SYSTEMS:  Positive for depression, weakness and weight gain.  She has been gaining about 10 pounds per year.  She states that she does  not really eat that much.   SOCIAL HISTORY:  Married.  Has foster children.  Denies any alcohol or  illegal drug use.   PHYSICAL EXAMINATION:  VITAL SIGNS:  Blood pressure 134/75, pulse 75,  respirations 18 and sat 97% on room air.  GENERAL:  Orientation x3.  Affect alert.  NEUROLOGIC:  Gait is normal.  EXTREMITIES:  Without edema in the upper or lower extremities.  She has  normal strength and sensation.  BACK:  Her back has good range of motion.  Her stimulator is well-  placed.  No tenderness around the pocket.  No evidence of hematoma.   IMPRESSION/PLAN:  1. Lumbar post-laminectomy syndrome, quite functional:  She has had      some increased lower extremity pain.  I think for this reason, we      will increase her Lyrica to 225 b.i.d.  This could be sedating.  2. Daytime lethargy:  She sleeps well.  Sort of follow up with her      primary physician on this Dr. Sanda Linger.      Erick Colace, M.D.  Electronically Signed     AEK/MedQ  D:  02/04/2009 14:32:55  T:  02/04/2009 15:04:00  Job #:  [id number]   cc:   Sanda Linger, MD  [address] [place] [address] [place] [address]

## 2010-11-22 NOTE — Assessment & Plan Note (Signed)
A 60 year old with a history of lumbar post-laminectomy syndrome and  chronic postoperative pain.  She has a spinal cord stimulator which she  uses.  She has had problem with sinusitis, which made her bunionectomy  to be rescheduled.  CT showed no significant sinus infection.  She has  not rescheduled her bunionectomy yet.   Her pain ranges in the 6-7 range which is stable.  Her pain is described  as constant aching and dull, increases with activity, 6-8.  Improves  with rest and medication, increases with bending.  She walks 2 minutes  at a time.  She drives.   REVIEW OF SYSTEMS:  Positive for trouble walking, anxiety, depression.  Negative for suicidal thoughts.  Positive for weight gain as well as  some constipation.   CURRENT MEDICATIONS:  1. Duragesic patch 75 mcg q.48 h.  2. Lyrica 150 mg p.o. b.i.d.  3. Oxycodone 15 mg q.i.d.   PHYSICAL EXAMINATION:  VITAL SIGNS:  Blood pressure 118/64, pulse 77,  respiration 18, and O2 sat 96% on room air.  GENERAL:  Well-developed, well-nourished female in no acute distress.  Orientation x3.  Affect is alert.  Gait is normal.  Her back has  moderate tenderness to palpation at the lumbosacral junction.  Also some  tenderness in the midback area as well as right upper trap area.   Motor strength is full in the upper and lower extremity.  Straight leg  raising is negative.   IMPRESSION:  1. Lumbar post-laminectomy syndrome.  2. Chronic postoperative pain.  3. Osteoarthritis, left first tarsal-metatarsophalangeal joint.   PLAN:  Continue current medications.  Once she is scheduled for surgery,  she will call me and get further instructions.  It would be helpful to  reduce her pain medicines temporarily for about 2 weeks prior to surgery  so that postoperatively she will have a better effect.  I would reduce  her breakthrough medications,  in fact going down from 4 oxycodone, then  to 3 times 2 days, 2 times 2 days, and 1 time 2 days and  then stop.      Erick Colace, M.D.  Electronically Signed     AEK/MedQ  D:  07/07/2008 09:15:15  T:  07/07/2008 22:59:55  Job #:  [id number]   cc:   Tylene Fantasia, PA   Leonides Grills, M.D.  Fax: [phone]

## 2010-11-22 NOTE — Assessment & Plan Note (Signed)
Michelle Kirby follows up today.  She was last seen by me March 12, 2007.  She has history of lumbar post laminectomy syndrome and chronic  radiculitis relatively well managed.   She is on the current medications:  1. Lyrica 150 b.i.d.  2. Oxycodone 15 mg q.i.d.; she takes this during the day.  3. Duragesic patch 75 mcg q.72h.   Her average pain score is in the 5/10 level which is somewhat improved  compared to last time, which was 6-7/10 level.  Her pain improves with  rest, medications, increases with more activity.  She is doing walking  up to an hour which I did congratulate her on.  She can climb steps, she  drives, she has some difficulty with certain household duties.   Her blood pressure 116/70, pulse 72, respirations 18, O2 saturation 96%  in room air.   EXAMINATION:  Her back has good range of motion, no pain to palpation.  Lower extremity strength and range of motion is normal.  Deep tendon  reflexes are normal.   IMPRESSION:  Lumbar post laminectomy syndrome using spinal cord  stimulator in combination with Lyrica for radicular pain and Duragesic  patch as well as oxycodone for her axial back pain.  She has finished  her physical therapy program.  She continues her home exercise program.  Continue to encourage aerobic activity.  Will use Senokot for  constipation.  I will see her back in 2 months, nursing visit in 1  month.      Erick Colace, M.D.  Electronically Signed     AEK/MedQ  D:  05/07/2007 16:23:50  T:  05/08/2007 08:47:43  Job #:  [id number]   cc:   Barbette Hair. [place], DO  [address] [place], [place] [address]

## 2010-11-22 NOTE — Assessment & Plan Note (Signed)
Michelle Kirby follows up today.  She was last seen by me in clinic  May 07, 2007. She had seen nursing last month for med counts with  life management.   She has had no new medical complications since I last saw her.  The pain  is 7/10 including both back pain and starting to get some groin pain.  She has had groin pain in the past before and she has had some workup  including ultrasound when she was in [place].  She states that she  had some type of bleeding complication from the ultrasound but they  could not find anything wrong with her.  She denies any back pain.  She  denies any thoracic pain.  She has had no falls or another type of  trauma.  Her sleep is good and she uses a spinal cord stimulator for  medications.  She continues to walk 30 minutes at a time when she walks  her dog.  She can climb steps.  She can drive.  She transfers  independently.  She needs some assistance with certain household duties.   REVIEW OF SYSTEMS:  Positive for weight gain due to decreased exercise  as well as depression.  She continues on Cymbalta.   EXAMINATION:  VITAL SIGNS:  Blood pressure 126/85, pulse 69,  respirations 18, O2 sat 99% room air.  IN GENERAL:  In no acute distress.  Affect appropriate.  BACK:  Has mild tenderness to palpation in the lower lumbar area,  however, not in the upper lumbar area.  Her hip range of motion is good  in internal and external rotation.  She has normal knee extension,  normal knee flexion as well as ankle range of motion.  Her motor  strength is full in the hip flexor and ankle dorsiflexors.  Her gait  shows no __________, toe drag or knee instability.  No antalgia.   IMPRESSION:  1. 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  2. Groin pain, intermittent.  No evidence of hip pathology on      examination.  If this persists, we will get a hip x-ray.  Now that      her possibility would be upper lumbar radicular discomfort, she  does not have any upper lumbar pain on range of motion and has full      range in her lumbar spine flexion, extension,  lateral rotation and      bending.   PLAN:  1. Will continue fentanyl patch 25 mg every 48 hours.  2. Continue oxycodone 15 mg q.i.d.  3. Lyrica 150 b.i.d..  4. Continue Cymbalta 30 mg per day.  5. I will see her back in two months to follow-up on her groin pain.      If this increases, we will work-up further.  6,  nursing visit in 1 month.  1. Maybe able to stretch out his acute treatment, if she remains      stable.      Erick Colace, M.D.  Electronically Signed     AEK/MedQ  D:  07/02/2007 12:46:26  T:  07/02/2007 13:28:05  Job #:  [id number]

## 2010-11-22 NOTE — Assessment & Plan Note (Signed)
A 59 year old female with history of lumbar post-laminectomy syndrome  and chronic postoperative pain.  She has a spinal cord stimulator which  she uses as well.  She has been doing some traveling and this is a  positive, however, left lower extremity surgery for bunionectomy had to  be rescheduled due to sinusitis.   Her pain score is in 6-7 range which is similar to prior.  Pain worse  during the evening hours, improves with rest and medications.  She could  walk 10 minutes at a time.  She does not climb steps.  She does drive.  She transfers alone.  She is independent with all her self-care.  She  has had some weight gain, night sweats, and diarrhea.   SOCIAL HISTORY:  Married.   FAMILY HISTORY:  Heart disease and high blood pressure.   Oswestry disability scale today 42%, this compares with 45% last visit,  stable.   PHYSICAL EXAMINATION:  VITAL SIGNS:  Blood pressure 115/73, pulse 88,  respiratory rate 18, and O2 sat 99% on room air.  GENERAL:  No acute distress.  Orientation x3.  Affect is alert.  Gait is  normal.  EXTREMITIES:  She has got good lumbar range of motion.  Extremities  without edema.  Normal lower extremity strength and range of motion with  the exception of left great toe, MTP joints.   IMPRESSION:  1. Lumbar post-laminectomy syndrome with chronic postoperative pain.  2. Osteoarthritis, left first tarsometatarsal phalangeal joint.   PLAN:  1. Continue Duragesic patch 75 mcg q.48 h.  2. Continue Lyrica 150 b.i.d.  3. Continue oxycodone 15 q.i.d.  She is to call me when she is      scheduled with her surgery.  I think we should temporarily reduce      preoperatively her oxycodone, so that she has a better effect      postoperatively.      Erick Colace, M.D.  Electronically Signed     AEK/MedQ  D:  06/09/2008 10:19:36  T:  06/09/2008 22:31:26  Job #:  [id number]   cc:   Leonides Grills, M.D.  Fax: [phone]   Barbette Hair. [place], DO  [address] [place], [place] [address]

## 2010-11-22 NOTE — Assessment & Plan Note (Signed)
A 60 year old female with lumbar post laminectomy syndrome, chronic  postoperative pain who has had a spinal cord stimulator implanted which  she still uses.  She had a bunionectomy approximately 1 month ago.  We  reduced her oxycodone to 15 b.i.d.  She is back up to q.i.d. postop.  She has had no postop complications.  She is here for followup.   PHYSICAL EXAMINATION:  No acute distress.  Mood and affect appropriate.  Her toe is well-healed.  Her back has no tenderness to palpation.  Lumbar spine range of motion is good.  Lower extremity strength is good.   IMPRESSION:  Lumbar post left make syndrome, chronic radicular  postoperative pain.   PLAN:  Continue Duragesic patch 75 mcg q.48 h. and oxycodone 15 q.i.d.   I will see her back in 6 weeks.      Erick Colace, M.D.  Electronically Signed     AEK/MedQ  D:  09/01/2008 10:38:02  T:  09/01/2008 23:47:47  Job #:  [id number]

## 2010-11-22 NOTE — Assessment & Plan Note (Signed)
Michelle Kirby follows up today.  She has a history of lumbar  postlaminectomy syndrome and chronic postoperative pain.  She has OA in  left first metatarsal phalangeal joint and set up to see Dr. Leonides Grills.   Pain score fairly similar to prior 6-7 level.  Interferes activity at 8-  9.  Sleep is good, relief from meds is good.   CURRENT MEDICATIONS:  1. Duragesic patch 75 mcg q.48 hours.  2. Oxycodone 15 mg q.i.d.  3. Lyrica 150 mg b.i.d.   She also use a spinal cord stimulator.   She does drive.  She does not climb steps.  She does household duties.  She complains of decreased posture.  She has problems with standing up  straight.   REVIEW OF SYSTEMS:  Positive for depression as well as weight gain and  low blood sugar.  She does have a primary care physician.   SOCIAL HISTORY:  Married, does not work outside the home.   PHYSICAL EXAMINATION:  VITAL SIGNS:  Blood pressure 137/82, pulse 77,  respirations 18, and O2 sat 99% on room air.  GENERAL:  No acute distress.  Orientation x3.  Affect is bright and  alert.  Gait is normal.  She is able to toe walk and heel walk.  She has good  lower extremity strength and lower extremity range of motion.  The  lumbar spine shows no evidence of scoliosis.  She has limited range of  motion of the lumbar spine about 75% forward flexion, 75% extension, and  75% lateral bending.   IMPRESSION:  Lumbar postlaminectomy syndrome and chronic postoperative  pain.   PLAN:  1. Continue Duragesic patch 75 mcg q. 72 hours.  2. Continue Lyrica 150 b.i.d.  3. Continue oxycodone 15 mg q.i.d.  4. Left first metatarsal phalangeal joint osteoarthritis, Dr. Lestine Box      to see her this month.   She showed no signs of narcotic abuse or misuse.  We will continue  current dosages and monitor as per protocol.      Michelle Kirby, M.D.  Electronically Signed     AEK/MedQ  D:  05/12/2008 09:40:14  T:  05/12/2008 22:01:37  Job #:  [id number]

## 2010-11-22 NOTE — Assessment & Plan Note (Signed)
REASON FOR VISIT:  01/08/07  Followup on chronic low back pain and lumbar postlaminectomy syndrome,  722.83,  with chronic radiculitis.   Ms. Leonardo returns after I last say her Nov 19, 2006.  She is done  with physical therapy and has been continuing her home exercise program.  She, however, has reduced the amount of walking she has done secondary  to the heat, but still continues to walk up to an hour at a time.  She  needs some assistance with household duties, but otherwise is  independent.  She has had some constipation, as opposed to last visit  where she has had some diarrhea.   INTERVAL HISTORY:  Has been treated for sinusitis.   EXAMINATION:  Blood pressure 100/58, pulse 70, respiratory rate 17, O2  sat 99% on room air.  GENERAL:  No acute distress.  Mood and affect appropriate.  Her back has no tenderness to palpation.  Good spinal range of motion.  Good strength in lower extremities. Gait shows no evidence of toe drag  or knee instability.  Able to toe walk.   IMPRESSION:  Lumbar postlaminectomy syndrome, chronic radicular pain but  no evidence of radiculopathy.   PLAN:  1. Continue Lyrica 150 b.i.d.  I looked at her legs; we reduced it to      75 b.i.d.  when she called in with swelling, but really she just      has a minimal amount.  2. Continue Duragesic patch 75 mcg q.72 hours.  3. Continue oxycodone 15 mg q.6 hours p.r.n.  4. I  will see her back in 2 months.  5. I encouraged resuming her usual walking pattern in the mornings      when it is cooler out.   She is planning to have a move in the next month and is somewhat anxious  about this as well.      Erick Colace, M.D.  Electronically Signed     AEK/MedQ  D:  01/15/2007 09:15:45  T:  01/15/2007 09:58:29  Job #:  [id number]   cc:   Barbette Hair. [place], DO  [address] [place], [place] [address]

## 2010-11-22 NOTE — Assessment & Plan Note (Signed)
Michelle Kirby follows up today. I last saw her August 28, 2007. She  has a history of lumbar post laminectomy syndrome. She has had some  intermittent groin pain, but this has not flared up in the last couple  of months. She continues to respond to the stimulator and has had this  on quite a bit more lately. She has had some overall flare up of pain  with some health issues related to her adopted daughter who is 6 or 59  years old and has been recently hospitalized.   CURRENT MEDICATIONS:  1. Duragesic patch 75 mcg q48 hours.  2. Lyrica 150 mg p.o. b.i.d.  3. Oxycodone 15 mg q.i.d.   Current pain level is 6/10.   REVIEW OF SYSTEMS:  As per Health and History form. Her functional  status is independent. Needs certain assist with household duties. She  can drive. She is independent with self-cares.   PHYSICAL EXAMINATION:  Blood pressure 126/78. Pulse 72, respirations 18.  O2 saturation is 100% room air.  GENERAL: In no acute distress. Mood and affect appropriate.  BACK: Has mild tenderness to palpation in the lumbar spine. She has full  range of motion of the lumbar spine. Normal lower extremity strength.  Normal lower extremity reflexes and range of motion.   IMPRESSION:  Lumbar post laminectomy syndrome. She is at a high  functional status on her current medications of Fentanyl patch 75 mcg  q48 hours, oxycodone 15 mg q.i.d. and Lyrica 150 b.i.d.   I will see her back in three months. She will have nursing visits  between now and then.      Erick Colace, M.D.  Electronically Signed     AEK/MedQ  D:  10/22/2007 16:49:04  T:  10/22/2007 19:03:07  Job #:  [id number]

## 2010-11-22 NOTE — Assessment & Plan Note (Signed)
Last visit was February 16, 2008, at which time we ordered an x-ray of her  foot.  She is having joint swelling of her foot, about 1-year history of  pain that seems to be increasing, question whether she is having gouty  arthritis versus just some OA.  X-ray did show fairly severe OA in the  left first metatarsophalangeal joint with subchondral sclerosis, joint  space narrowing.   She almost thinks her toe pain is worse than her back pain at this  point.   Her pain score is in 7-8/10 range which is fairly consistent with prior.  Her Oswestry disability score is 51%.   MEDICATIONS:  Include,  1. Duragesic patch 75 mcg q.48 h.  2. Oxycodone 15 mg q.i.d.  3. She also takes Cymbalta 30 mg per day.  4. Lyrica 150 b.i.d.   PHYSICAL EXAMINATION:  Her left great toe has no evidence of erythema.  There is no hypersensitivity to touch.  It is enlarged at the first MTP  compared to the right side, and she does have tenderness around the  first MTP joint line.   She has normal sensation.  Her back has mild tenderness to palpation.  She has good lumbar spine range of motion.   IMPRESSION:  1. Lumbar post laminectomy syndrome.  She has been on Duragesic patch      75 mcg q.48 h., oxycodone 15 mg q.i.d., and she is on Lyrica 150      b.i.d. for her neuropathic pain.  She has a spinal cord stimulator      that she uses as well although she thinks that only gives her some      partial relief.  2. First metatarsophalangeal joint pain, left foot.  Some advanced      osteoarthritic changes.  I do not really think she has gout, do not      think we need to aspirate today.   Discussed treatment options including modifying shoe wear, trying a  nonsteroidal cream, injecting the joint with corticosteroid or  orthopedic ankle specialist referal.  At this point, we will use  Voltaren gel to the first MTP joint q.i.d., and send her to Dr. Lestine Box.      Erick Colace, M.D.  Electronically  Signed     AEK/MedQ  D:  03/17/2008 13:07:25  T:  03/18/2008 03:22:46  Job #:  [id number]   cc:   Leonides Grills, M.D.  Fax: [phone]

## 2010-11-22 NOTE — Assessment & Plan Note (Signed)
Ms. Teague is doing better in terms of her toes.  She is back to  baseline at this point.   Intercurrent medical problems include strep throat.  She has a prior  history of lumbar postlaminectomy syndrome and chronic postoperative  pain.  She has a spinal cord stimulator, which she does use, her average  pain is 6-7/10, interferes with activity at 6-7/10 level.  Sleep is  fair.  Pain is worse with bending, improves with rest and medications.  She can walk 45 minutes at a time.  She does not climb steps.  She does  drive.  She transfers alone.  She is independent.   REVIEW OF SYSTEMS:  Positive for weakness, weight gain, and  constipation.   PAST MEDICAL HISTORY:  High blood pressure.   SOCIAL HISTORY:  Married.   FAMILY HISTORY:  Heart disease, high blood pressure, and disability.   PHYSICAL EXAMINATION:  VITAL SIGN:  Blood pressure 107/61, pulse 62,  respirations 18, and O2 saturation 97% on room air.  GENERAL:  No acute stress.  Orientation x3.   She is able toe-walk, heel walk.  Lumbar spine range of motion is full.  Lower extremity and upper extremity strength is full.   IMPRESSION:  Lumbar postlaminectomy syndrome.  She is quite functional  on her current medication regimen.  Her Oswestry score is 40%.  She  continues to use her spinal cord stimulator.  She is wondering about the  battery life on her stimulator, asked her to contact Glade Nurse.  The  representative for [employer], in this area.  I will see her back  in about 3 months', nursing visit in 1 month.      Erick Colace, M.D.  Electronically Signed     AEK/MedQ  D:  11/10/2008 16:56:43  T:  11/11/2008 07:11:50  Job #:  [id number]   cc:   Leonides Grills, M.D.  Fax: [phone]

## 2010-11-22 NOTE — Assessment & Plan Note (Signed)
HISTORY:  Ms. Kressin returns today.  I last saw her in April.  From a  back standpoint, she is doing quite well on the current medications,  Fentanyl patch 75 mcg q.48 h., oxycodone 50 mg q.i.d., and Lyrica 150  b.i.d.   Her main complaint at this time is the problem in the left great toe,  this is actually at the base of the toe at the first MTP.  She states  that she bumped it last year, but really has not gotten any better.  She  states that even sometimes with touching, it cause pain.  She denies a  history of gout.  She has had an ER visit on December 12, 2007.  Last  Duragesic prescription filled on December 12, 2007.   I looked at ED visit report from 2008, she did have an infection in her  toe, treated with doxycycline.   PHYSICAL EXAMINATION:  GENERAL:  No acute distress.  Mood and affect is  appropriate.  BACK:  Range of motion is good.  EXTREMITIES:  Her first MTP showed some swelling unilaterally.  No  hypersensitivity to touch.  No erythema.  She has reduced active range  of motion in that area.   IMPRESSION:  1. First metatarsophalangeal pain.  2. History of prior foot/ankle trauma.  Question whether this may be      more gout versus a non-healed fracture.  She has had no imaging      studies of it in the past.  Now, we ordered an x-ray.  Follow up in      1 month at which time we would do joint aspiration if Xray -also      check for opiate compliance.      Erick Colace, M.D.  Electronically Signed     AEK/MedQ  D:  01/16/2008 17:08:28  T:  01/17/2008 10:42:53  Job #:  [id number]

## 2010-11-25 NOTE — Assessment & Plan Note (Signed)
This is a followup of lumbar postlaminectomy syndrome, chronic  radiculitis.   HISTORY:  [age]-year-old female, 59 to 13-year history of low back  pain, lumbar postlaminectomy syndrome, initially seen July 13, 2006.  She had a urine drug screen which came back normal, or at least  consistent with the medications she was taking.  We did increase her  fentanyl from 25 to 50, but changed from q. 48 to q. 72.  She is having  problems with third day breakthrough pain.  OxyContin was discontinued  and switched to oxycodone.  She had been on OxyContin 40 t.i.d. and now  down to oxycodone 15 mg, which she takes an average of about 5 tablets  per day, i.e. 75 mg total versus 120.   Her constipation is improved with the Senokot S.   She has had no new medical complications since I last saw her other than  bronchitis, and has been on prednisone and Avelox.   The patient is 6-7/10 back and left lower extremity.  She would like to  also contact the bionics rep for stimulator adjustment.   Her back has no tenderness to palpation.  She has good range of motion  in the lumbar spine.  She has normal strength in the lower extremities.   IMPRESSION:  1. Lumbar postlaminectomy syndrome, chronic radiculitis, stable on      current medications.  We will continue gabapentin 300 mg t.i.d. and      1200 at night.  2. Continue Duragesic at 50 mcg, but change q. 48 hours.  3. Write for 5 times per day oxycodone and anticipate reduction of      dosage on this on the q. 48 hours after we establish pill count.      Erick Colace, M.D.  Electronically Signed     AEK/MedQ  D:  08/03/2006 17:16:12  T:  08/04/2006 01:39:45  Job #:  [id number]   cc:   Barbette Hair. [place], DO  [address] [place], [place] [address]

## 2010-11-25 NOTE — Assessment & Plan Note (Signed)
A 60 year old with a 13 year history of low back pain, post laminectomy  syndrome, spinal cord stimulator placement.  Has had improvement with  bionic stimulator readjustment, fentanyl patch at 75 q. 72 hours,  oxycodone 15 mg 120 per month.  UDS repeat was appropriate.  Complains  of tiredness which he has had for quite a long time.  Not exercising,  really never has had an exercise program through therapy.  Adjuvant  medications include Neurontin, she takes 1200 mg at night and 300 during  the day, she uses the Neurontin to help her sleep.  Never has tried  Lyrica, has tried Topamax which made her too drowsy.  Other adjuvants  not prescribed by this office include Cymbalta, prescribed by Dr. Artist Pais.   EXAMINATION:  GENERAL:  In no acute distress, mood and affect  appropriate.  Affect is flattened.  BACK:  Has no tenderness to palpation, she has good spine range of  motion.  Deep tendon reflexes are 3+ bilateral lower extremities.  Motor strength  is full.  Bilateral lower extremities range of motion is full.  VITALS:  Blood pressure 122/85, pulse 98, respirations 16, O2 sat 94%  room air.   IMPRESSION:  Lumbar postlaminectomy syndrome, chronic radicular pain.  Radicular pain is the bigger issue at this time.  We also discussed the  drowsiness and that I do not think it is due to any medication changes  in terms of her opiates, given that we have switched on equal analgesic  dose, with some overall reduction.   PLAN:  1. Discontinue Neurontin and switch to Lyrica 50 b.i.d. time one week,      and then 75 b.i.d. times one week, then go up to 150 b.i.d.  2. I will see her back in one month.  3. Fitness focus program.  I do think exercise can be required to help      with some of her symptomatology.      Erick Colace, M.D.  Electronically Signed     AEK/MedQ  D:  09/25/2006 08:57:59  T:  09/25/2006 09:27:36  Job #:  [id number]   cc:   Barbette Hair. [place], DO  [address] [place], [place] [address]

## 2010-11-25 NOTE — Assessment & Plan Note (Signed)
Date of last visit was October 23, 2006.   REASON FOR VISIT:  Followup for chronic low back and lower extremity  pain, history of lumbar post-laminectomy syndrome and radiculitis.   MR. Michelle Kirby returns today after I last saw her on October 23, 2006. She  is doing quite well at this time. She started on physical therapy at  [facility] location. She is trying to eat more  vegetables and salads and in fact has lost 8 pounds. She has gotten back  to walking on a regular basis at 60 minutes at a time. She is climbing  steps, she is driving. She rates her pain as a 5-6/10 on average and  this appears at 6/10 at last visit.   Her sleep is good. She is using her spinal cord stimulator during the  day   CURRENT MEDICATIONS:  1. Duragesic patch 75 mcg q72 hours.  2. Oxycodone 15 mg q6 p.r.n.  3. Lyrica 150 b.i.d.   PHYSICAL EXAMINATION:  GENERAL: In no acute distress. Mood and affect  appropriate.  Motor strength is full bilateral upper and lower extremities. Range of  motion is full bilateral lower extremities. Her gait is normal. She is  able to toe walk and heel walk. Her spine range of motion is 75% forward  flexion and 50% extension.   IMPRESSION:  Lumbar post-laminectomy syndrome with chronic radicular  pain.   PLAN:  1. Continue Lyrica 150 b.i.d.  2. Continue Duragesic patch 75 mcg q72 hours.  3. Continue oxycodone 15 mg q6 hours p.r.n.  4. Continue spinal cord stimulator.  5. Continue physical therapy. Transition to home exercise program this      month.   I plan to see her back in two months. She will pick up her prescriptions  in the interim time.      Erick Colace, M.D.  Electronically Signed     AEK/MedQ  D:  11/19/2006 15:47:38  T:  11/19/2006 20:01:02  Job #:  [id number]   cc:   Barbette Hair. [place], DO  [address] [place], [place] [address]

## 2010-11-25 NOTE — Group Therapy Note (Signed)
Friday, July 13, 2006   HISTORY:  Michelle Kirby is a 60 year old female who gives a 12-13 year  history of chronic low back pain.  She notes she had a racquet ball  injury following a minor work injury, but she had gradually increasing  pain over time.  She has had primary low back and left lower extremity  symptoms including numbness and pain.  She underwent a lumbar  laminectomy in the year 2000.  She underwent lumbar fusion at L5-S1  level, per her report, in 2003.  Because of persistent lower extremity  pain, she underwent spinal cord stimulator placed in 2005 and this was  revised in 2006.   In terms of her pain medication, pre-spinal cord stimulator, she was  using 150 mcg q.72h., she is now down to 25 mcg q.72h.  She was on 80 mg  OxyContin t.i.d. and she is down to 40 mg t.i.d.  I have reviewed the  pain management physician's note from [place] and the patient's history  corroborates with his notes.  She was given a trial of Lyrica and  Gabitril for lower extremity pain. Topamax caused some excessive  drowsiness.  She states she was on Ritalin and this actually caused  drowsiness in her situation and she was told that she may have some  ADHD.   PAST MEDICAL HISTORY:  Significant for hypertension, history of  hyperlipidemia, anemia, dysfunctional uterine bleeding.  She has a  history of depression and has been on Cymbalta for this.   PAST VOCATIONAL HISTORY:  Armed forces training and education officer for 18 years, last worked in  2004.   REVIEW OF SYSTEMS:  Currently, complaining of constipation.  She has had  night sweats attributed to recent reduction in Prempro dosage.  Also,  problems with constipation chronically and numbness in the left foot.  She has had depression but no suicidal thoughts.  30 minute walking  time, not able to climb steps very well, she does not have any need for  assistive devices.   SOCIAL HISTORY:  She is married.  She is caring for three adopted  children that have  special needs, ages 33, 44, and 82.   FAMILY HISTORY:  Heart disease and high blood pressure.   PHYSICAL EXAMINATION:  Blood pressure 113/72, pulse 88, respiratory rate  18, O2 saturation 98% on room air.  In general, in no acute distress.  Mood and affect appropriate.  Orientation x3.  Affect is bright and  alert.  Gait is normal.  Her motor strength is full bilateral upper and  lower extremities.  Range of motion is full bilateral upper and lower  extremities.  No sacroiliac or hip pain on testing.  In addition, she  has normal deep tendon reflexes bilateral upper and lower extremities  and no evidence of fasciculation or muscle atrophy distally.  Back has  good range of motion forward flexion and 75% of normal with extension,  is able to do lateral rotation and bending.   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   1. She has been stable and functional on the current medications,      however, she does note that leg pain is improved with taking      Gabapentin during the daytime as well as night, therefore, instead      of giving her just 1200 mg at nighttime, we will give her 300 mg      q.i.d.  2. I also pointed out to her that she  is on two long acting narcotic      analgesics and, assuming we have normal or appropriate urine drug      screen, we will convert her from extended release oxycodone to a      higher dose of Duragesic and switch her to an immediate release      Oxycodone.  She is in agreement with this.  3. We will start her on Senokot-S for constipation.   I will see her back in one month but assuming her urine drug screen  shows no evidence of illicit drug use or non-disclosed opiates, will  have her pick up her prescriptions which will be a 50 mcg Duragesic  patch plus Oxycodone 15 mg immediate release t.i.d.  We may be able to  wean down the Oxycodone from there.      Erick Colace, M.D.  Electronically Signed      AEK/MedQ  D:  07/13/2006 14:55:03  T:  07/13/2006 17:16:02  Job #:  [id number]   cc:   Barbette Hair. [place], DO  [address] [place], [place] [address]

## 2010-11-25 NOTE — Assessment & Plan Note (Signed)
The patient returns today and overall, she is doing better.  She started  some physical therapy.  She is still complaining of fatigue, which has  been going on for several months, it has not changed appreciable since  the switch from Neurontin to Lyrica about a month ago.  She has had some  weight gain, she is trying to diet.   She has had no new medical problems in interval time.   EXAM:  GENERAL:  In no acute distress, mood and affect appropriate.  BACK:  Has no tenderness to palpation.  She has good spinal range of  motion.  Deep tendon reflexes are 3+ bilateral extremities.  Motor  strength is full.  Range of motion is full bilateral lower extremities.  VITALS:  Are 122/80, pulse 75, respiratory rate of 16, O2 saturation 96%  room air.   Pain level of 6/10.   IMPRESSION:  Lumbar post-laminectomy syndrome, chronic radicular pain.  Using spinal cord stimulator in combination with Lyrica 150 b.i.d. and  Duragesic patch 75 q. 72 hours and oxycodone 15 mg q. 6 p.r.n.  breakthrough.   PLAN:  1. I will see her back in one month.  2. Doreatha Martin out her physical therapy program.  3. In terms of her fatigue, she is wondering whether she should have      her thyroid checked.  I referred her to a primary physician for      further assessment.      Erick Colace, M.D.  Electronically Signed     AEK/MedQ  D:  10/23/2006 09:30:39  T:  10/23/2006 10:15:52  Job #:  [id number]   cc:   Barbette Hair. [place], DO  [address] [place], [place] [address]

## 2010-11-25 NOTE — Assessment & Plan Note (Signed)
[facility]                             PRIMARY CARE OFFICE NOTE   NAME:Michelle Kirby, Michelle Kirby                      MRN:          [id number]  DATE:05/07/2006                            DOB:          [birth year]    CHIEF COMPLAINT:  New patient to practice.   HISTORY OF PRESENT ILLNESS:  The patient is a 60 year old white female who  has recently moved from [place] to [place]. She is here to establish  primary care. Her past medical history is significant for hypertension for  the last 10 years, high cholesterol, depression, and chronic low back pain  followed by pain management specialist in [place]. The patient states  that she has taken steps to establish with a pain management specialist in  [place]. She underwent back surgery in 1997 and has been to several  specialists including rheumatology and orthopedic physicians. At one point,  they considered chronic fibromyalgia, also lupus, because of positive  reported ANA. However, the patient states that her back pain has been  attributed to her history of lumbar fusion.   Currently, she has tapered her fentanyl patch. She states at one point she  was on 150-mcg q.72h. patch which has been weaned to 25 mcg. However, she  still takes oxycodone 40 mg 1 to 2 tablets t.i.d. p.r.n. for breakthrough  pain.   PAST MEDICAL HISTORY:  1. History of anemia.  2. Dysfunctional uterine bleeding.  3. History of depression.  4. Hypertension.  5. History of hyperlipidemia.  6. Status post lumbar fusion in 1997.  7. Status post breast biopsy.   CURRENT MEDICATIONS:  1. Oxycodone 40 mg 1 to 2 tablets t.i.d.  2. Cymbalta 60 mg once a day.  3. Neurontin 600 mg at bedtime.  4. Metoprolol 50 mg 1 a day.  5. Fentanyl patch 25 mcg every other day.  6. Prempro 0.625/2.5 one a day.  7. Micardis 80 mg once a day.   ALLERGIES TO MEDICATIONS:  None known.   SOCIAL HISTORY:  The patient is married. She has 3  grown children of her own  and also 3 adopted children, ages 12, 33 and 37. 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 She has been disabled for 3 years.   FAMILY HISTORY:  Mother deceased at age 74 secondary to complications of  liver and pancreatic cancer. Father is alive at age 49 but has a history of  prostate cancer and is status post CABG. Brother is deceased secondary to  hypertension and coronary artery disease. No report of colon cancer in the  family.   HABITS:  She does not drink. Quit tobacco 15 years ago. She was a 2-pack-per-  day smoker for 20 years.   PREVENTATIVE CARE HISTORY:  Last Pap was on April 29, 2006. Last tetanus  shot was in 2005. She has not had any colon cancer screening.   REVIEW OF SYSTEMS:  No fevers, chills. The patient wears contacts. Denies  any chronic nasal congestion. She is getting over recent upper respiratory  infection, and she denies any chest pain. She  did have a stress  echocardiogram performed that was normal 6 months ago. Denies any chronic  cough, shortness of breath. Denies any heartburn, nausea, vomiting. No  changes in bowel habits and all other systems are negative.   PHYSICAL EXAMINATION:  Height is 5 foot 3-1/2, weight is 150 pounds,  temperature is 97.3, pulse is 72, BP is 125/75 in the left arm in the seated  position.  In general, the patient is a pleasant, 60 year old white female in no  apparent distress, appears her stated age.  HEENT:  Normocephalic, atraumatic. Pupils are equal and reactive to light  bilaterally. Extraocular muscles intact. Patient was anicteric. Conjunctivae  was within normal limits. External auditory canals and tympanic membranes  were clear bilaterally. Oropharynx was unremarkable.  NECK:  Was supple. No adenopathy, carotid bruits or thyromegaly.  CHEST:  Normal inspiratory effort. Chest was clear to auscultation  bilaterally. No rhonchi, rales or wheezing.  CARDIOVASCULAR:  Regular  rate and rhythm. No significant murmurs, rubs or  gallops appreciated.  ABDOMEN:  Soft, nontender, positive bowel sounds. No organomegaly.  MUSCULOSKELETAL EXAM:  No clubbing, cyanosis, or edema. The patient had  intact pedis dorsalis pulses, equal and symmetric.  SKIN:  Warm and dry.  NEUROLOGICAL:  Cranial nerves II-XII was grossly intact. She was nonfocal.   IMPRESSION/RECOMMENDATIONS:  1. Chronic pain secondary to lumbar fusion.  2. Chronic narcotic use.  3. Anxiety/depression.  4. Hypertension.  5. History of hyperlipidemia.  6. History of anemia.  7. History of dysfunctional uterine bleeding.  8. Hormone replacement/menopause.  9. Health maintenance.   RECOMMENDATIONS:  We will facilitate referral to local pain management  specialist. We discussed the policy of patient's obtaining pain medications  only from her pain doctor. We will require notes from her previous pain  specialists before providing prescriptions until she is established with  local pain management specialists.   She has already followed or established with a GYN, and they are in the  process of tapering off her hormone replacement which I agree with.   We will obtain baseline labs including kidney function, recheck cholesterol  with history of hyperlipidemia, as well as her thyroid functions.   Followup is in approximately four to six weeks. We will discuss colon  screening need.     Barbette Hair. [place], DO    RDY/MedQ  DD: 05/07/2006  DT: 05/08/2006  Job #: [id number]

## 2010-11-25 NOTE — Assessment & Plan Note (Signed)
A 60 year old female, 12-13 year history of back pain, post laminectomy  syndrome.  Spinal cord stimulator placed in [place], has not had  adjustments to her Bionics stimulator.  Fentanyl patch changed from q.72  to q.48.  Problems with breakthrough, not any different on the 3rd  versus 2nd day.  Down to 15 mg 5 tablets per day.   No new medical complications.  Tired during the day.  Has had this  problem before with narcotic analgesics.  Tried Provigil and Ritalin,  neither of which really helped her symptoms much.  Wondering how she  could lose weight, had some weight loss with Topamax in the past, but  this did make her tired as well.   Her pain is about 7 out of 10.  She walks 20 minutes at a time, she  climbs steps, she drives.  She needs some assistance with certain  household duties.   She is married, has children, ages 35-13, at home.   Blood pressure is 125/85, pulse 73, respiratory rate 16, O2 sat 96% on  room air.  GENERAL:  No acute distress.  Mood and affect are appropriate.  Gait is normal.  She has good knee range of motion, hip range of motion,  intact lower extremity strength, and reflexes.   IMPRESSION:  1. Lumbar post laminectomy syndrome, chronic neuropathic pain with      spinal cord stimulator.  Had a Bionics system placed in [place].      She will have a nursing visit in conjunction with Bionics      representative to reprogram stimulator.  2. Increased Duragesic 75 mcg q.72 h., which is a change from the 50      q.48.  3. Reduce Oxycodone to 4 tablets per day on average, do pill count and      urine drug screen in 2 weeks.      Erick Colace, M.D.  Electronically Signed     AEK/MedQ  D:  08/28/2006 14:24:03  T:  08/28/2006 16:35:19  Job #:  [id number]   cc:   Barbette Hair. [place], DO  [address] [place], [place] [address]

## 2010-11-27 ENCOUNTER — Other Ambulatory Visit: Payer: Self-pay | Admitting: Internal Medicine

## 2010-12-15 ENCOUNTER — Ambulatory Visit (INDEPENDENT_AMBULATORY_CARE_PROVIDER_SITE_OTHER): Payer: BC Managed Care – PPO | Admitting: Internal Medicine

## 2010-12-15 ENCOUNTER — Encounter: Payer: Self-pay | Admitting: Internal Medicine

## 2010-12-15 VITALS — BP 118/80 | HR 71 | Temp 98.2°F | Resp 16

## 2010-12-15 DIAGNOSIS — T300 Burn of unspecified body region, unspecified degree: Secondary | ICD-10-CM

## 2010-12-15 DIAGNOSIS — L02519 Cutaneous abscess of unspecified hand: Secondary | ICD-10-CM

## 2010-12-15 DIAGNOSIS — Z23 Encounter for immunization: Secondary | ICD-10-CM

## 2010-12-15 MED ORDER — SULFAMETHOXAZOLE-TRIMETHOPRIM 800-160 MG PO TABS: 1.0000 | ORAL_TABLET | Freq: Two times a day (BID) | ORAL | Status: AC

## 2010-12-15 NOTE — Assessment & Plan Note (Signed)
Tdap today and continue local wound care
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
## 2010-12-15 NOTE — Patient Instructions (Signed)
Cellulitis Cellulitis is an infection of the skin and the tissue beneath it. The area is typically red and tender. It is caused by germs (bacteria) (usually staph or strep) that enter the body through cuts or sores. Cellulitis most commonly occurs in the arms or lower legs.  HOME CARE INSTRUCTIONS  If you are given a prescription for medications which kill germs (antibiotics), take as directed until finished.   If the infection is on the arm or leg, keep the limb elevated as able.   Use a warm cloth several times per day to relieve pain and encourage healing.   See your caregiver for recheck of the infected site in 3 days or sooner if problems arise.   Only take over-the-counter or prescription medicines for pain, discomfort, or fever as directed by your caregiver.  SEEK MEDICAL CARE IF:  An oral temperature above 100.5 develops, not controlled by medication.   The area of redness (inflammation) is spreading, there are red streaks coming from the infected site, or if a part of the infection begins to turn dark in color.   The joint or bone underneath the infected skin becomes painful after the skin has healed.   The infection returns in the same or another area after it seems to have gone away.   A boil or bump swells up. This may be an abscess.   New, unexplained problems such as pain or fever develop.  SEEK IMMEDIATE MEDICAL CARE IF:  You or your child feels drowsy or lethargic.   There is vomiting, diarrhea, or lasting discomfort or feeling ill (malaise) with muscle aches and pains.  MAKE SURE YOU:   Understand these instructions.   Will watch your condition.   Will get help right away if you are not doing well or get worse.  Document Released: 04/05/2005 Document Re-Released: 04/23/2009 [facility] Patient Information 2011 [place], [place].

## 2010-12-15 NOTE — Assessment & Plan Note (Signed)
Start bactrim-ds and follow closely

## 2010-12-27 ENCOUNTER — Encounter (HOSPITAL_BASED_OUTPATIENT_CLINIC_OR_DEPARTMENT_OTHER): Payer: BC Managed Care – PPO

## 2011-01-18 ENCOUNTER — Encounter: Payer: Self-pay | Admitting: Internal Medicine

## 2011-01-18 ENCOUNTER — Ambulatory Visit (INDEPENDENT_AMBULATORY_CARE_PROVIDER_SITE_OTHER): Payer: BC Managed Care – PPO | Admitting: Internal Medicine

## 2011-01-18 VITALS — BP 118/68 | HR 77 | Temp 98.7°F

## 2011-01-18 DIAGNOSIS — B370 Candidal stomatitis: Secondary | ICD-10-CM

## 2011-01-18 DIAGNOSIS — L0201 Cutaneous abscess of face: Secondary | ICD-10-CM | POA: Insufficient documentation

## 2011-01-18 DIAGNOSIS — L03211 Cellulitis of face: Secondary | ICD-10-CM | POA: Insufficient documentation

## 2011-01-18 MED ORDER — DIPHENHYD-HYDROCORT-NYSTATIN MT SUSP: 5.0000 mL | Freq: Four times a day (QID) | OROMUCOSAL | Status: DC | PRN

## 2011-01-18 MED ORDER — SULFAMETHOXAZOLE-TRIMETHOPRIM 800-160 MG PO TABS: 1.0000 | ORAL_TABLET | Freq: Two times a day (BID) | ORAL | Status: AC

## 2011-01-18 NOTE — Patient Instructions (Signed)
Cellulitis Cellulitis is an infection of the skin and the tissue beneath it. The area is typically red and tender. It is caused by germs (bacteria) (usually staph or strep) that enter the body through cuts or sores. Cellulitis most commonly occurs in the arms or lower legs.  HOME CARE INSTRUCTIONS  If you are given a prescription for medications which kill germs (antibiotics), take as directed until finished.   If the infection is on the arm or leg, keep the limb elevated as able.   Use a warm cloth several times per day to relieve pain and encourage healing.   See your caregiver for recheck of the infected site in 5 days or sooner if problems arise.   Only take over-the-counter or prescription medicines for pain, discomfort, or fever as directed by your caregiver.  SEEK MEDICAL CARE IF:  An oral temperature above 100.5 develops, not controlled by medication.   The area of redness (inflammation) is spreading, there are red streaks coming from the infected site, or if a part of the infection begins to turn dark in color.   The joint or bone underneath the infected skin becomes painful after the skin has healed.   The infection returns in the same or another area after it seems to have gone away.   A boil or bump swells up. This may be an abscess.   New, unexplained problems such as pain or fever develop.  SEEK IMMEDIATE MEDICAL CARE IF:  You or your child feels drowsy or lethargic.   There is vomiting, diarrhea, or lasting discomfort or feeling ill (malaise) with muscle aches and pains.  MAKE SURE YOU:   Understand these instructions.   Will watch your condition.   Will get help right away if you are not doing well or get worse.  Document Released: 04/05/2005 Document Re-Released: 04/23/2009 [facility] Patient Information 2011 [place], [place].

## 2011-01-18 NOTE — Progress Notes (Signed)
  Subjective:    Patient ID: Michelle Kirby, female    DOB: August 11, 1950, 60 y.o.   MRN: [id number]  HPI She has redness and swelling on and inside her nose, she took a course of bactrim-ds and it improved but now it has returned. Also, she has a film on her tongue and she thinks that it is thrush.    Review of Systems  Constitutional: Negative for fever, chills, diaphoresis, activity change, appetite change, fatigue and unexpected weight change.  HENT: Positive for facial swelling (across her nose). Negative for neck pain and neck stiffness.   Eyes: Negative for photophobia, pain, discharge, redness, itching and visual disturbance.  Respiratory: Negative.   Cardiovascular: Negative.   Gastrointestinal: Negative.   Genitourinary: Negative.   Musculoskeletal: Negative.   Skin: Negative for color change, pallor, rash and wound.  Neurological: Negative.   Hematological: Negative.   Psychiatric/Behavioral: Negative.        Objective:   Physical Exam  Vitals reviewed. Constitutional: She is oriented to person, place, and time. She appears well-developed and well-nourished. No distress.  HENT:  Head: Normocephalic and atraumatic. No trismus in the jaw.  Right Ear: External ear normal.  Left Ear: External ear normal.  Nose: Nose normal.  Mouth/Throat: Uvula is midline, oropharynx is clear and moist and mucous membranes are normal. Mucous membranes are not pale, not dry and not cyanotic. She does not have dentures. No oral lesions. Normal dentition. No dental abscesses, uvula swelling, lacerations or dental caries. No oropharyngeal exudate, posterior oropharyngeal edema, posterior oropharyngeal erythema or tonsillar abscesses.  Eyes: Conjunctivae and EOM are normal. Pupils are equal, round, and reactive to light. Right eye exhibits no discharge. Left eye exhibits no discharge. No scleral icterus.  Neck: Normal range of motion. Neck supple. No JVD present. No tracheal deviation present. No  thyromegaly present.  Cardiovascular: Normal rate, regular rhythm, normal heart sounds and intact distal pulses.  Exam reveals no friction rub.   No murmur heard. Pulmonary/Chest: Effort normal and breath sounds normal. No stridor. No respiratory distress. She has no wheezes. She has no rales. She exhibits no tenderness.  Abdominal: Soft. Bowel sounds are normal. She exhibits no distension. There is no tenderness. There is no rebound and no guarding.  Musculoskeletal: Normal range of motion. She exhibits no edema and no tenderness.  Lymphadenopathy:    She has no cervical adenopathy.  Neurological: She is alert and oriented to person, place, and time. She has normal reflexes. She displays normal reflexes. No cranial nerve deficit. She exhibits normal muscle tone. Coordination normal.  Skin: Skin is warm, dry and intact. No rash noted. She is not diaphoretic. No erythema. No pallor.       Her nose is bulbous with erythema in the mildine with mild swelling, there are no vesicles, and there is no exudate, induration, fluctuance, streaking, or pigmented lesions  Psychiatric: She has a normal mood and affect. Her behavior is normal. Judgment and thought content normal.          Assessment & Plan:

## 2011-01-18 NOTE — Assessment & Plan Note (Signed)
Try nystatin mouthwash

## 2011-01-18 NOTE — Assessment & Plan Note (Signed)
I cultured her nasopharynx for MRSA and started her on SMX-TMP, I do not see an abscess

## 2011-01-19 ENCOUNTER — Other Ambulatory Visit: Payer: BC Managed Care – PPO

## 2011-01-19 DIAGNOSIS — L0201 Cutaneous abscess of face: Secondary | ICD-10-CM

## 2011-01-22 LAB — WOUND CULTURE: Gram Stain: NONE SEEN

## 2011-02-23 ENCOUNTER — Ambulatory Visit (INDEPENDENT_AMBULATORY_CARE_PROVIDER_SITE_OTHER): Payer: BC Managed Care – PPO | Admitting: Internal Medicine

## 2011-02-23 ENCOUNTER — Encounter: Payer: Self-pay | Admitting: Internal Medicine

## 2011-02-23 ENCOUNTER — Other Ambulatory Visit (INDEPENDENT_AMBULATORY_CARE_PROVIDER_SITE_OTHER): Payer: BC Managed Care – PPO

## 2011-02-23 ENCOUNTER — Other Ambulatory Visit: Payer: Self-pay | Admitting: Internal Medicine

## 2011-02-23 VITALS — BP 140/84 | HR 76 | Temp 97.4°F | Resp 16 | Wt 160.0 lb

## 2011-02-23 DIAGNOSIS — E785 Hyperlipidemia, unspecified: Secondary | ICD-10-CM

## 2011-02-23 DIAGNOSIS — I10 Essential (primary) hypertension: Secondary | ICD-10-CM

## 2011-02-23 DIAGNOSIS — B370 Candidal stomatitis: Secondary | ICD-10-CM

## 2011-02-23 LAB — CBC WITH DIFFERENTIAL/PLATELET
Basophils Absolute: 0.1 10*3/uL (ref 0.0–0.1)
Eosinophils Absolute: 0.2 10*3/uL (ref 0.0–0.7)
Lymphocytes Relative: 47.6 % — ABNORMAL HIGH (ref 12.0–46.0)
Lymphs Abs: 2.2 10*3/uL (ref 0.7–4.0)
MCHC: 33.5 g/dL (ref 30.0–36.0)
Monocytes Relative: 8.4 % (ref 3.0–12.0)
Platelets: 196 10*3/uL (ref 150.0–400.0)
RDW: 12.9 % (ref 11.5–14.6)

## 2011-02-23 LAB — LIPID PANEL
Cholesterol: 213 mg/dL — ABNORMAL HIGH (ref 0–200)
VLDL: 28.2 mg/dL (ref 0.0–40.0)

## 2011-02-23 LAB — COMPREHENSIVE METABOLIC PANEL
ALT: 26 U/L (ref 0–35)
Albumin: 3.9 g/dL (ref 3.5–5.2)
CO2: 30 mEq/L (ref 19–32)
Calcium: 8.8 mg/dL (ref 8.4–10.5)
Chloride: 101 mEq/L (ref 96–112)
GFR: 82.82 mL/min (ref >60.00)
Glucose, Bld: 78 mg/dL (ref 70–99)
Potassium: 4.2 mEq/L (ref 3.5–5.1)
Sodium: 137 mEq/L (ref 135–145)
Total Bilirubin: 0.8 mg/dL (ref 0.3–1.2)
Total Protein: 6.8 g/dL (ref 6.0–8.3)

## 2011-02-23 LAB — TSH: TSH: 1.22 u[IU]/mL (ref 0.35–5.50)

## 2011-02-23 MED ORDER — FLUCONAZOLE 200 MG PO TABS: 200.0000 mg | ORAL_TABLET | Freq: Every day | ORAL | Status: AC

## 2011-02-23 MED ORDER — NEBIVOLOL HCL 5 MG PO TABS: 5.0000 mg | ORAL_TABLET | Freq: Every day | ORAL | Status: DC

## 2011-02-23 NOTE — Assessment & Plan Note (Signed)
Add Bystolic to the ARB for better BP control

## 2011-02-23 NOTE — Patient Instructions (Signed)

## 2011-02-23 NOTE — Assessment & Plan Note (Addendum)
Will try fluconazole, will check labs to look for secondary causes

## 2011-02-23 NOTE — Progress Notes (Signed)
Subjective:    Patient ID: Michelle Kirby, female    DOB: 02-Jan-1951, 60 y.o.   MRN: [id number]  Hypertension This is a chronic problem. The current episode started more than 1 year ago. The problem has been gradually worsening since onset. The problem is uncontrolled. Pertinent negatives include no anxiety, blurred vision, chest pain, headaches, malaise/fatigue, neck pain, orthopnea, palpitations, peripheral edema, PND, shortness of breath or sweats. Agents associated with hypertension include estrogens. Past treatments include angiotensin blockers. The current treatment provides mild improvement. There are no compliance problems.   Also, she tells me that she wakes up each morning with a thick/white coating on her tongue that she brushes off with a toothbrush, she wants this to be treated as thrush and says that nystatin sw/sw has not helped.    Review of Systems  Constitutional: Negative for fever, chills, malaise/fatigue, diaphoresis, activity change, appetite change, fatigue and unexpected weight change.  HENT: Negative for congestion, sore throat, facial swelling, rhinorrhea, sneezing, drooling, mouth sores, trouble swallowing, neck pain, neck stiffness, dental problem, voice change, postnasal drip, sinus pressure and ear discharge.   Eyes: Negative.  Negative for blurred vision.  Respiratory: Negative for apnea, cough, choking, chest tightness, shortness of breath, wheezing and stridor.   Cardiovascular: Negative for chest pain, palpitations, orthopnea, leg swelling and PND.  Gastrointestinal: Negative for nausea, vomiting, abdominal pain, diarrhea, constipation, blood in stool and abdominal distention.  Genitourinary: Negative for dysuria, urgency, frequency, hematuria, enuresis, difficulty urinating and dyspareunia.  Musculoskeletal: Negative for myalgias, back pain, joint swelling, arthralgias and gait problem.  Skin: Negative for color change, pallor, rash and wound.  Neurological:  Negative for dizziness, tremors, seizures, syncope, facial asymmetry, speech difficulty, weakness, light-headedness, numbness and headaches.  Hematological: Negative for adenopathy. Does not bruise/bleed easily.  Psychiatric/Behavioral: Negative for suicidal ideas, hallucinations, behavioral problems, confusion, sleep disturbance, self-injury, dysphoric mood, decreased concentration and agitation. The patient is not nervous/anxious and is not hyperactive.        Objective:   Physical Exam  Vitals reviewed. Constitutional: She is oriented to person, place, and time. She appears well-developed and well-nourished. No distress.  HENT:  Head: Normocephalic and atraumatic. No trismus in the jaw.  Right Ear: External ear normal.  Left Ear: External ear normal.  Nose: Nose normal.  Mouth/Throat: Uvula is midline, oropharynx is clear and moist and mucous membranes are normal. Mucous membranes are not pale, not dry and not cyanotic. She does not have dentures. No oral lesions. Abnormal dentition (100% dentures). Dental caries (debtures) present. No dental abscesses, uvula swelling or lacerations. No oropharyngeal exudate, posterior oropharyngeal edema, posterior oropharyngeal erythema or tonsillar abscesses.  Eyes: Conjunctivae and EOM are normal. Pupils are equal, round, and reactive to light. Right eye exhibits no discharge. No scleral icterus.  Neck: Normal range of motion. Neck supple. No JVD present. No tracheal deviation present. No thyromegaly present.  Cardiovascular: Normal rate, regular rhythm, normal heart sounds and intact distal pulses.  Exam reveals no gallop and no friction rub.   No murmur heard. Pulmonary/Chest: Effort normal and breath sounds normal. No stridor. No respiratory distress. She has no wheezes. She has no rales. She exhibits no tenderness.  Abdominal: Soft. Bowel sounds are normal. She exhibits no distension. There is no tenderness. There is no rebound and no guarding.    Musculoskeletal: Normal range of motion. She exhibits no edema and no tenderness.  Lymphadenopathy:    She has no cervical adenopathy.  Neurological: She is alert and oriented to  person, place, and time. She has normal reflexes. She displays normal reflexes. No cranial nerve deficit. Coordination normal.  Skin: Skin is warm and dry. No rash noted. She is not diaphoretic. No erythema. No pallor.  Psychiatric: She has a normal mood and affect. Her behavior is normal. Judgment and thought content normal.      Lab Results  Component Value Date   WBC 4.4* 05/28/2006   HGB 14.1 05/28/2006   HCT 41.8 05/28/2006   PLT 222 05/28/2006   CHOL 206* 10/19/2009   TRIG 136.0 10/19/2009   HDL 56.40 10/19/2009   LDLDIRECT 128.6 10/19/2009   ALT 24 10/19/2009   AST 20 10/19/2009   NA 141 10/19/2009   K 4.8 10/19/2009   CL 103 10/19/2009   CREATININE 0.9 10/19/2009   BUN 9 10/19/2009   CO2 32 10/19/2009   TSH 0.97 10/19/2009      Assessment & Plan:

## 2011-02-23 NOTE — Assessment & Plan Note (Signed)
Will check FLP, TSH, and CMP

## 2011-02-24 ENCOUNTER — Encounter: Payer: Self-pay | Admitting: Internal Medicine

## 2011-04-10 ENCOUNTER — Encounter: Payer: Self-pay | Admitting: Internal Medicine

## 2011-04-10 ENCOUNTER — Ambulatory Visit (INDEPENDENT_AMBULATORY_CARE_PROVIDER_SITE_OTHER): Payer: Medicare Other | Admitting: Internal Medicine

## 2011-04-10 VITALS — BP 132/84 | HR 77 | Temp 99.2°F | Resp 16 | Wt 157.0 lb

## 2011-04-10 DIAGNOSIS — J209 Acute bronchitis, unspecified: Secondary | ICD-10-CM | POA: Insufficient documentation

## 2011-04-10 MED ORDER — AZITHROMYCIN 500 MG PO TABS: 500.0000 mg | ORAL_TABLET | Freq: Every day | ORAL | Status: AC

## 2011-04-10 NOTE — Progress Notes (Signed)
  Subjective:    Patient ID: Michelle Kirby, female    DOB: 02/06/1951, 60 y.o.   MRN: [id number]  URI  This is a new problem. Episode onset: 2 weeks. The problem has been gradually worsening. The maximum temperature recorded prior to her arrival was 100 - 100.9 F. The fever has been present for 5 days or more. Associated symptoms include congestion, coughing, rhinorrhea, sinus pain and a sore throat. Pertinent negatives include no abdominal pain, chest pain, diarrhea, dysuria, ear pain, headaches, joint pain, joint swelling, nausea, neck pain, plugged ear sensation, rash, sneezing, swollen glands, vomiting or wheezing. She has tried nothing for the symptoms.      Review of Systems  Constitutional: Positive for fever. Negative for chills, diaphoresis, activity change, appetite change, fatigue and unexpected weight change.  HENT: Positive for congestion, sore throat and rhinorrhea. Negative for hearing loss, ear pain, nosebleeds, facial swelling, sneezing, trouble swallowing, neck pain, neck stiffness, voice change, postnasal drip, sinus pressure, tinnitus and ear discharge.   Eyes: Negative.   Respiratory: Positive for cough. Negative for apnea, choking, chest tightness, shortness of breath, wheezing and stridor.   Cardiovascular: Negative for chest pain, palpitations and leg swelling.  Gastrointestinal: Negative for nausea, vomiting, abdominal pain, diarrhea, constipation and blood in stool.  Genitourinary: Negative for dysuria, urgency, frequency, hematuria, flank pain, decreased urine volume, enuresis, difficulty urinating and dyspareunia.  Musculoskeletal: Negative for joint pain.  Skin: Negative for color change, pallor, rash and wound.  Neurological: Negative for dizziness, tremors, seizures, syncope, facial asymmetry, speech difficulty, weakness, light-headedness, numbness and headaches.  Hematological: Negative for adenopathy. Does not bruise/bleed easily.  Psychiatric/Behavioral:  Negative.        Objective:   Physical Exam  Vitals reviewed. Constitutional: She is oriented to person, place, and time. She appears well-developed and well-nourished. No distress.  HENT:  Head: Normocephalic and atraumatic.  Right Ear: Hearing, tympanic membrane, external ear and ear canal normal.  Left Ear: Hearing, tympanic membrane, external ear and ear canal normal.  Nose: Nose normal.  Mouth/Throat: Oropharynx is clear and moist. Mucous membranes are not pale, not dry and not cyanotic. No oropharyngeal exudate, posterior oropharyngeal edema, posterior oropharyngeal erythema or tonsillar abscesses.  Eyes: Conjunctivae are normal. Left eye exhibits no discharge. No scleral icterus.  Neck: Normal range of motion. Neck supple. No JVD present. No tracheal deviation present. No thyromegaly present.  Cardiovascular: Normal rate, regular rhythm, normal heart sounds and intact distal pulses.  Exam reveals no gallop and no friction rub.   No murmur heard. Pulmonary/Chest: Effort normal and breath sounds normal. No stridor. No respiratory distress. She has no wheezes. She has no rales. She exhibits no tenderness.  Abdominal: Soft. Bowel sounds are normal. She exhibits no distension and no mass. There is no tenderness. There is no rebound and no guarding.  Musculoskeletal: Normal range of motion. She exhibits no edema and no tenderness.  Lymphadenopathy:    She has no cervical adenopathy.  Neurological: She is oriented to person, place, and time. She displays normal reflexes. No cranial nerve deficit. She exhibits normal muscle tone. Coordination normal.  Skin: Skin is warm and dry. No rash noted. She is not diaphoretic. No erythema. No pallor.  Psychiatric: She has a normal mood and affect. Her behavior is normal. Judgment and thought content normal.          Assessment & Plan:

## 2011-04-10 NOTE — Assessment & Plan Note (Signed)
It sounds like this started as a viral uri but it has lingered for 2 weeks so I suspect she has a secondary bacterial component and will start zpak

## 2011-04-10 NOTE — Patient Instructions (Signed)
Bronchitis Bronchitis is the body's way of reacting to injury and/or infection (inflammation) of the bronchi. Bronchi are the air tubes that extend from the windpipe into the lungs. If the inflammation becomes severe, it may cause shortness of breath.  CAUSES Inflammation may be caused by:  A virus.   Germs (bacteria).   Dust.   Allergens.   Pollutants and many other irritants.  The cells lining the bronchial tree are covered with tiny hairs (cilia). These constantly beat upward, away from the lungs, toward the mouth. This keeps the lungs free of pollutants. When these cells become too irritated and are unable to do their job, mucus begins to develop. This causes the characteristic cough of bronchitis. The cough clears the lungs when the cilia are unable to do their job. Without either of these protective mechanisms, the mucus would settle in the lungs. Then you would develop pneumonia. Smoking is a common cause of bronchitis and can contribute to pneumonia. Stopping this habit is the single most important thing you can do to help yourself. TREATMENT  Your caregiver may prescribe an antibiotic if the cough is caused by bacteria. Also, medicines that open up your airways make it easier to breathe. Your caregiver may also recommend or prescribe an expectorant. It will loosen the mucus to be coughed up. Only take over-the-counter or prescription medicines for pain, discomfort, or fever as directed by your caregiver.   Removing whatever causes the problem (smoking, for example) is critical to preventing the problem from getting worse.   Cough suppressants may be prescribed for relief of cough symptoms.   Inhaled medicines may be prescribed to help with symptoms now and to help prevent problems from returning.   For those with recurrent (chronic) bronchitis, there may be a need for steroid medicines.  SEEK IMMEDIATE MEDICAL CARE IF:  During treatment, you develop more pus-like mucus  (purulent sputum).   You or your child has an oral temperature above 100.5, not controlled by medicine.   Your baby is older than 3 months with a rectal temperature of 102 F (38.9 C) or higher.   Your baby is 3 months old or younger with a rectal temperature of 100.4 F (38 C) or higher.   You become progressively more ill.   You have increased difficulty breathing, wheezing, or shortness of breath.  It is necessary to seek immediate medical care if you are elderly or sick from any other disease. MAKE SURE YOU:  Understand these instructions.   Will watch your condition.   Will get help right away if you are not doing well or get worse.  Document Released: 06/26/2005 Document Re-Released: 09/20/2009 [employer] Patient Information 2011 [employer]. 

## 2011-04-17 ENCOUNTER — Ambulatory Visit (INDEPENDENT_AMBULATORY_CARE_PROVIDER_SITE_OTHER): Payer: BC Managed Care – PPO

## 2011-04-17 ENCOUNTER — Inpatient Hospital Stay (INDEPENDENT_AMBULATORY_CARE_PROVIDER_SITE_OTHER)
Admission: RE | Admit: 2011-04-17 | Discharge: 2011-04-17 | Disposition: A | Discharge status: Home / Self Care | Payer: BC Managed Care – PPO | Source: Ambulatory Visit | Attending: Family Medicine | Admitting: Family Medicine

## 2011-04-17 DIAGNOSIS — J309 Allergic rhinitis, unspecified: Secondary | ICD-10-CM

## 2011-04-23 ENCOUNTER — Other Ambulatory Visit: Payer: Self-pay | Admitting: Internal Medicine

## 2011-05-16 ENCOUNTER — Other Ambulatory Visit: Payer: Self-pay | Admitting: Pain Medicine

## 2011-05-16 ENCOUNTER — Ambulatory Visit
Admission: RE | Admit: 2011-05-16 | Discharge: 2011-05-16 | Disposition: A | Discharge status: Home / Self Care | Payer: Medicare Other | Source: Ambulatory Visit | Attending: Pain Medicine | Admitting: Pain Medicine

## 2011-05-16 DIAGNOSIS — M542 Cervicalgia: Secondary | ICD-10-CM

## 2011-05-16 DIAGNOSIS — M79602 Pain in left arm: Secondary | ICD-10-CM

## 2011-07-25 ENCOUNTER — Encounter: Payer: Self-pay | Admitting: Internal Medicine

## 2011-07-25 ENCOUNTER — Ambulatory Visit (INDEPENDENT_AMBULATORY_CARE_PROVIDER_SITE_OTHER): Payer: Medicare Other | Admitting: Internal Medicine

## 2011-07-25 ENCOUNTER — Other Ambulatory Visit (INDEPENDENT_AMBULATORY_CARE_PROVIDER_SITE_OTHER): Payer: Medicare Other

## 2011-07-25 DIAGNOSIS — K143 Hypertrophy of tongue papillae: Secondary | ICD-10-CM

## 2011-07-25 DIAGNOSIS — I10 Essential (primary) hypertension: Secondary | ICD-10-CM

## 2011-07-25 DIAGNOSIS — E785 Hyperlipidemia, unspecified: Secondary | ICD-10-CM

## 2011-07-25 DIAGNOSIS — N951 Menopausal and female climacteric states: Secondary | ICD-10-CM

## 2011-07-25 LAB — LIPID PANEL
Cholesterol: 254 mg/dL — ABNORMAL HIGH (ref 0–200)
HDL: 67.3 mg/dL (ref >39.00)

## 2011-07-25 LAB — CBC WITH DIFFERENTIAL/PLATELET
Basophils Absolute: 0.1 10*3/uL (ref 0.0–0.1)
Eosinophils Absolute: 0.1 10*3/uL (ref 0.0–0.7)
Lymphocytes Relative: 32.4 % (ref 12.0–46.0)
MCHC: 34.9 g/dL (ref 30.0–36.0)
Monocytes Relative: 8.1 % (ref 3.0–12.0)
Neutro Abs: 3.4 10*3/uL (ref 1.4–7.7)
Neutrophils Relative %: 57.7 % (ref 43.0–77.0)
Platelets: 298 10*3/uL (ref 150.0–400.0)
RDW: 13.1 % (ref 11.5–14.6)

## 2011-07-25 LAB — COMPREHENSIVE METABOLIC PANEL
Albumin: 3.8 g/dL (ref 3.5–5.2)
Alkaline Phosphatase: 68 U/L (ref 39–117)
BUN: 13 mg/dL (ref 6–23)
Calcium: 9 mg/dL (ref 8.4–10.5)
Creatinine, Ser: 0.8 mg/dL (ref 0.4–1.2)
Glucose, Bld: 96 mg/dL (ref 70–99)
Potassium: 4.2 mEq/L (ref 3.5–5.1)

## 2011-07-25 LAB — TSH: TSH: 1.18 u[IU]/mL (ref 0.35–5.50)

## 2011-07-25 NOTE — Patient Instructions (Signed)

## 2011-07-26 ENCOUNTER — Encounter: Payer: Self-pay | Admitting: Internal Medicine

## 2011-07-26 LAB — FOLLICLE STIMULATING HORMONE: FSH: 21.9 m[IU]/mL

## 2011-07-26 NOTE — Progress Notes (Signed)
Subjective:    Patient ID: Michelle Kirby, female    DOB: 27-Jan-1951, 61 y.o.   MRN: [id number]  Hypertension This is a chronic problem. The current episode started more than 1 year ago. The problem has been gradually improving since onset. The problem is controlled. Pertinent negatives include no anxiety, blurred vision, chest pain, headaches, malaise/fatigue, neck pain, orthopnea, palpitations, peripheral edema, PND, shortness of breath or sweats. There are no associated agents to hypertension. Past treatments include angiotensin blockers and beta blockers. The current treatment provides significant improvement. Compliance problems include exercise and diet.  There is no history of chronic renal disease.  Hyperlipidemia This is a chronic problem. The current episode started more than 1 year ago. The problem is uncontrolled. Recent lipid tests were reviewed and are variable. Exacerbating diseases include obesity. She has no history of chronic renal disease, diabetes, hypothyroidism, liver disease or nephrotic syndrome. Factors aggravating her hyperlipidemia include estrogen and beta blockers. Pertinent negatives include no chest pain, focal sensory loss, focal weakness, leg pain, myalgias or shortness of breath. She is currently on no antihyperlipidemic treatment. Compliance problems include adherence to exercise and adherence to diet.       Review of Systems  Constitutional: Negative for fever, chills, malaise/fatigue, diaphoresis, activity change, appetite change and fatigue.  HENT: Positive for dental problem (she conts to c/o a coating on her tongue and wants to see a dentist for a bx). Negative for hearing loss, ear pain, nosebleeds, congestion, sore throat, facial swelling, rhinorrhea, sneezing, drooling, mouth sores, trouble swallowing, neck pain, neck stiffness, voice change, postnasal drip, sinus pressure, tinnitus and ear discharge.   Eyes: Negative.  Negative for blurred vision.    Respiratory: Negative for cough, chest tightness, shortness of breath, wheezing and stridor.   Cardiovascular: Negative for chest pain, palpitations, orthopnea, leg swelling and PND.  Gastrointestinal: Negative for nausea, vomiting, abdominal pain, diarrhea and constipation.  Genitourinary: Negative for dysuria, urgency, frequency, hematuria, decreased urine volume, enuresis, difficulty urinating and dyspareunia.  Musculoskeletal: Negative for myalgias, back pain, joint swelling, arthralgias and gait problem.  Skin: Negative for color change, pallor, rash and wound.  Neurological: Negative for dizziness, tremors, focal weakness, seizures, syncope, facial asymmetry, speech difficulty, weakness, light-headedness, numbness and headaches.  Hematological: Negative for adenopathy. Does not bruise/bleed easily.  Psychiatric/Behavioral: Negative.        Objective:   Physical Exam  Vitals reviewed. Constitutional: She is oriented to person, place, and time. She appears well-developed and well-nourished. No distress.  HENT:  Head: Normocephalic and atraumatic. No trismus in the jaw.  Mouth/Throat: Oropharynx is clear and moist and mucous membranes are normal. Mucous membranes are not pale, not dry and not cyanotic. No uvula swelling. No oropharyngeal exudate, posterior oropharyngeal edema, posterior oropharyngeal erythema or tonsillar abscesses.  Eyes: Conjunctivae are normal. Right eye exhibits no discharge. Left eye exhibits no discharge. No scleral icterus.  Neck: Normal range of motion. Neck supple. No JVD present. No tracheal deviation present. No thyromegaly present.  Cardiovascular: Normal rate, regular rhythm, normal heart sounds and intact distal pulses.  Exam reveals no gallop and no friction rub.   No murmur heard. Pulmonary/Chest: Effort normal and breath sounds normal. No stridor. No respiratory distress. She has no wheezes. She has no rales. She exhibits no tenderness.  Abdominal: Soft.  Bowel sounds are normal. She exhibits no distension. There is no tenderness. There is no rebound and no guarding.  Musculoskeletal: Normal range of motion. She exhibits no edema and no tenderness.  Lymphadenopathy:    She has no cervical adenopathy.  Neurological: She is oriented to person, place, and time.  Skin: Skin is warm and dry. No rash noted. She is not diaphoretic. No erythema. No pallor.  Psychiatric: She has a normal mood and affect. Her behavior is normal. Judgment and thought content normal.          Assessment & Plan:

## 2011-07-26 NOTE — Assessment & Plan Note (Signed)
Her BP is well controlled, I will check her lytes and renal function 

## 2011-07-26 NOTE — Assessment & Plan Note (Signed)
Dental referral as requested

## 2011-07-26 NOTE — Assessment & Plan Note (Signed)
I will check her FLP today 

## 2011-07-26 NOTE — Assessment & Plan Note (Signed)
Will check labs to look for secondary causes

## 2011-07-27 ENCOUNTER — Encounter: Payer: Self-pay | Admitting: Internal Medicine

## 2011-07-27 LAB — LUTEINIZING HORMONE: LH: 5.71 m[IU]/mL

## 2011-10-21 ENCOUNTER — Other Ambulatory Visit: Payer: Self-pay | Admitting: Internal Medicine

## 2011-11-23 LAB — HM PAP SMEAR

## 2011-12-20 ENCOUNTER — Ambulatory Visit (INDEPENDENT_AMBULATORY_CARE_PROVIDER_SITE_OTHER): Payer: Medicare Other | Admitting: Internal Medicine

## 2011-12-20 ENCOUNTER — Encounter: Payer: Self-pay | Admitting: Internal Medicine

## 2011-12-20 VITALS — BP 122/82 | HR 81 | Temp 98.2°F | Resp 16 | Wt 157.8 lb

## 2011-12-20 DIAGNOSIS — J209 Acute bronchitis, unspecified: Secondary | ICD-10-CM | POA: Insufficient documentation

## 2011-12-20 LAB — HM COLONOSCOPY

## 2011-12-20 MED ORDER — AMOXICILLIN 875 MG PO TABS: 875.0000 mg | ORAL_TABLET | Freq: Two times a day (BID) | ORAL | Status: DC

## 2011-12-20 NOTE — Patient Instructions (Signed)

## 2011-12-20 NOTE — Progress Notes (Signed)
Subjective:    Patient ID: Michelle Kirby, female    DOB: 26-Dec-1950, 61 y.o.   MRN: [id number]  Cough This is a new problem. The current episode started in the past 7 days. The problem has been gradually worsening. The problem occurs every few hours. The cough is productive of purulent sputum. Associated symptoms include chills, nasal congestion, rhinorrhea and a sore throat. Pertinent negatives include no chest pain, ear congestion, ear pain, fever, headaches, heartburn, hemoptysis, myalgias, postnasal drip, rash, shortness of breath, sweats, weight loss or wheezing. Nothing aggravates the symptoms. She has tried nothing for the symptoms.      Review of Systems  Constitutional: Positive for chills. Negative for fever, weight loss, diaphoresis, activity change, appetite change, fatigue and unexpected weight change.  HENT: Positive for congestion, sore throat and rhinorrhea. Negative for hearing loss, ear pain, nosebleeds, facial swelling, sneezing, drooling, mouth sores, trouble swallowing, neck pain, neck stiffness, dental problem, voice change, postnasal drip, sinus pressure, tinnitus and ear discharge.   Eyes: Negative.   Respiratory: Positive for cough. Negative for apnea, hemoptysis, choking, chest tightness, shortness of breath, wheezing and stridor.   Cardiovascular: Negative for chest pain, palpitations and leg swelling.  Gastrointestinal: Negative for heartburn, nausea, vomiting, abdominal pain, diarrhea, blood in stool, abdominal distention, anal bleeding and rectal pain.  Genitourinary: Negative.   Musculoskeletal: Negative for myalgias.  Skin: Negative for color change, rash and wound.  Neurological: Negative for dizziness, tremors, seizures, syncope, facial asymmetry, speech difficulty, weakness, light-headedness, numbness and headaches.  Hematological: Negative for adenopathy. Does not bruise/bleed easily.  Psychiatric/Behavioral: Negative.        Objective:   Physical  Exam  Vitals reviewed. Constitutional: She is oriented to person, place, and time. She appears well-developed and well-nourished. No distress.  HENT:  Head: Normocephalic and atraumatic. No trismus in the jaw.  Right Ear: Hearing, tympanic membrane, external ear and ear canal normal.  Left Ear: Hearing, tympanic membrane, external ear and ear canal normal.  Nose: Mucosal edema and rhinorrhea present. No nose lacerations, sinus tenderness, nasal deformity, septal deviation or nasal septal hematoma. No epistaxis.  No foreign bodies. Right sinus exhibits maxillary sinus tenderness. Right sinus exhibits no frontal sinus tenderness. Left sinus exhibits maxillary sinus tenderness. Left sinus exhibits no frontal sinus tenderness.  Mouth/Throat: Oropharynx is clear and moist and mucous membranes are normal. Mucous membranes are not pale, not dry and not cyanotic. No uvula swelling. No oropharyngeal exudate, posterior oropharyngeal edema or posterior oropharyngeal erythema.  Eyes: Conjunctivae are normal. Right eye exhibits no discharge. Left eye exhibits no discharge. No scleral icterus.  Neck: Normal range of motion. Neck supple. No JVD present. No tracheal deviation present. No thyromegaly present.  Cardiovascular: Normal rate, regular rhythm, normal heart sounds and intact distal pulses.  Exam reveals no gallop and no friction rub.   No murmur heard. Pulmonary/Chest: Effort normal and breath sounds normal. No stridor. No respiratory distress. She has no wheezes. She has no rales. She exhibits no tenderness.  Abdominal: Soft. Bowel sounds are normal. She exhibits no distension and no mass. There is no tenderness. There is no rebound and no guarding.  Musculoskeletal: Normal range of motion. She exhibits no edema and no tenderness.  Lymphadenopathy:    She has no cervical adenopathy.  Neurological: She is oriented to person, place, and time.  Skin: Skin is warm and dry. No rash noted. She is not  diaphoretic. No erythema. No pallor.  Psychiatric: She has a normal mood and  affect. Her behavior is normal. Judgment and thought content normal.      Lab Results  Component Value Date   WBC 5.9 07/25/2011   HGB 13.8 07/25/2011   HCT 39.5 07/25/2011   PLT 298.0 07/25/2011   GLUCOSE 96 07/25/2011   CHOL 254* 07/25/2011   TRIG 160.0* 07/25/2011   HDL 67.30 07/25/2011   LDLDIRECT 156.1 07/25/2011   ALT 16 07/25/2011   AST 13 07/25/2011   NA 137 07/25/2011   K 4.2 07/25/2011   CL 102 07/25/2011   CREATININE 0.8 07/25/2011   BUN 13 07/25/2011   CO2 28 07/25/2011   TSH 1.18 07/25/2011   HGBA1C 5.4 02/23/2011     Assessment & Plan:

## 2011-12-20 NOTE — Assessment & Plan Note (Signed)
Start amoxil for the infection 

## 2012-04-23 ENCOUNTER — Other Ambulatory Visit: Payer: Self-pay | Admitting: Internal Medicine

## 2012-05-01 ENCOUNTER — Other Ambulatory Visit: Payer: Self-pay | Admitting: Internal Medicine

## 2012-06-05 ENCOUNTER — Other Ambulatory Visit: Payer: Self-pay

## 2012-06-05 MED ORDER — TELMISARTAN 80 MG PO TABS: ORAL_TABLET | ORAL | Status: DC

## 2012-08-15 IMAGING — CR DG CERVICAL SPINE WITH FLEX & EXTEND
8 series · 8 of 8 positions shown · non-contrast
Comparison: None.

CLINICAL DATA: Chronic neck pain, bilateral radiculopathy

CERVICAL SPINE COMPLETE WITH FLEXION AND EXTENSION VIEWS

[view not recorded (1 of 8)]
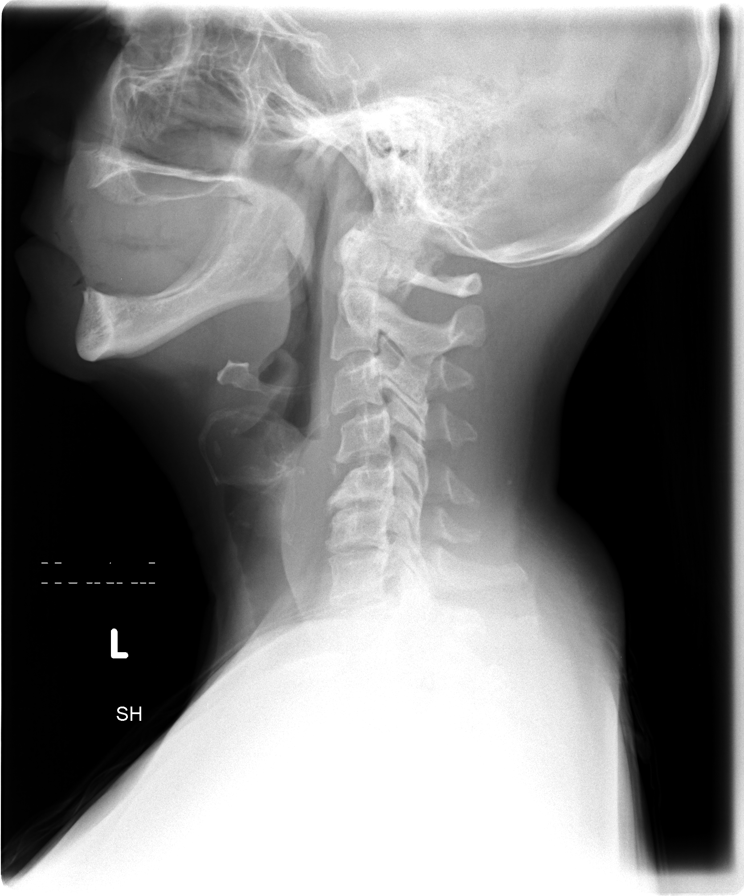

[view not recorded (2 of 8)]
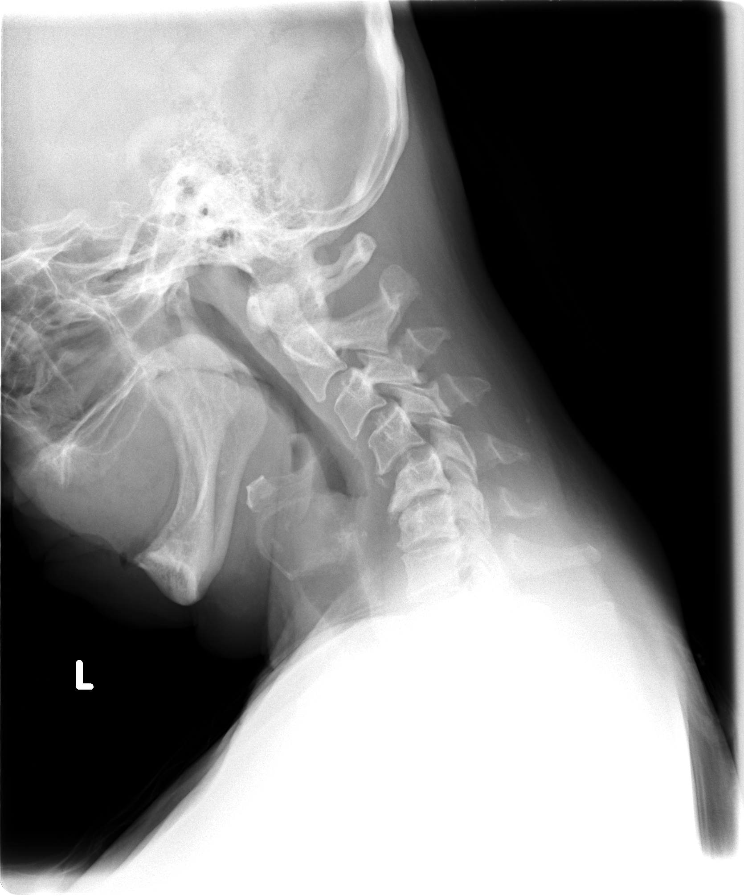

[view not recorded (3 of 8)]
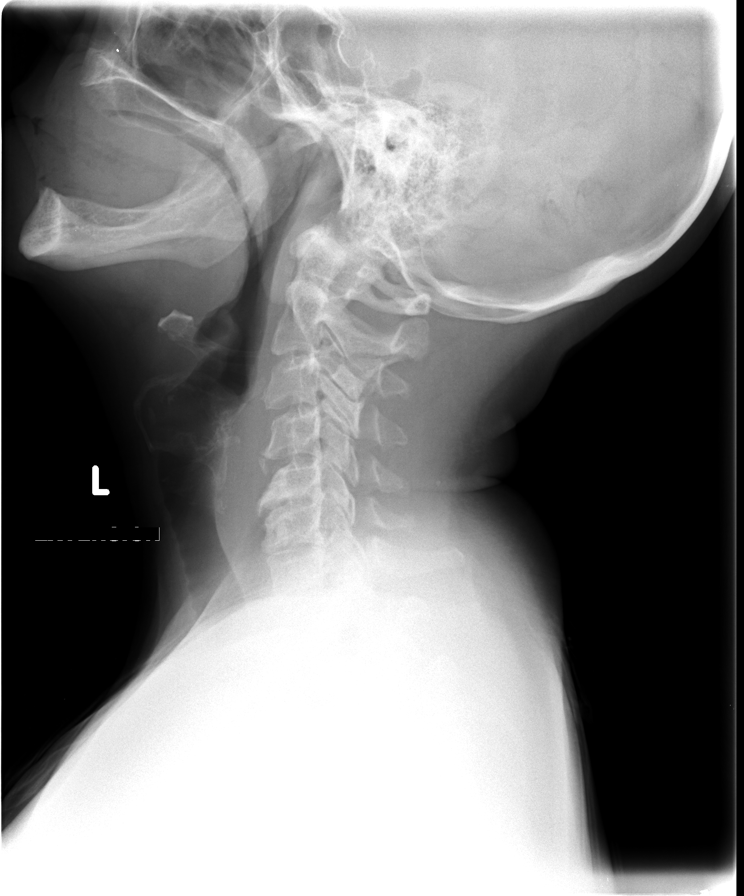

[view not recorded (4 of 8)]
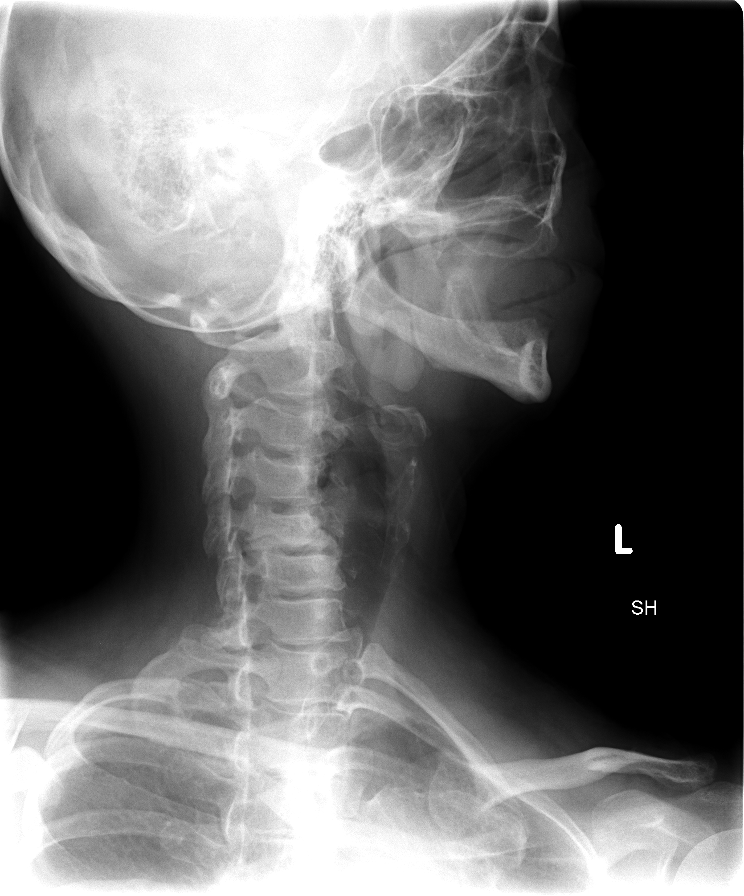

[view not recorded (5 of 8)]
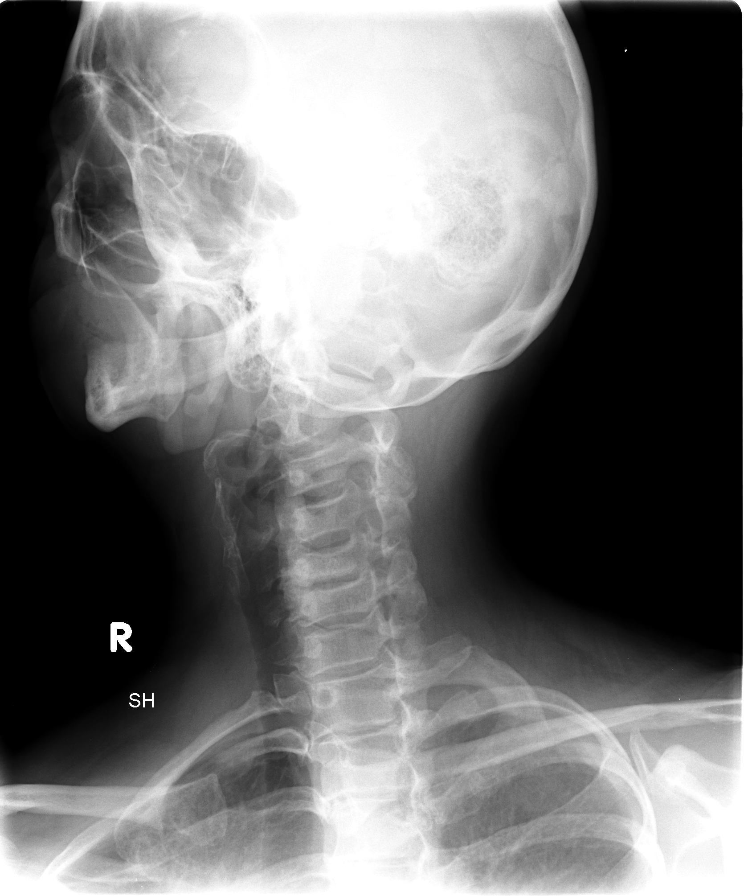

[view not recorded (6 of 8)]
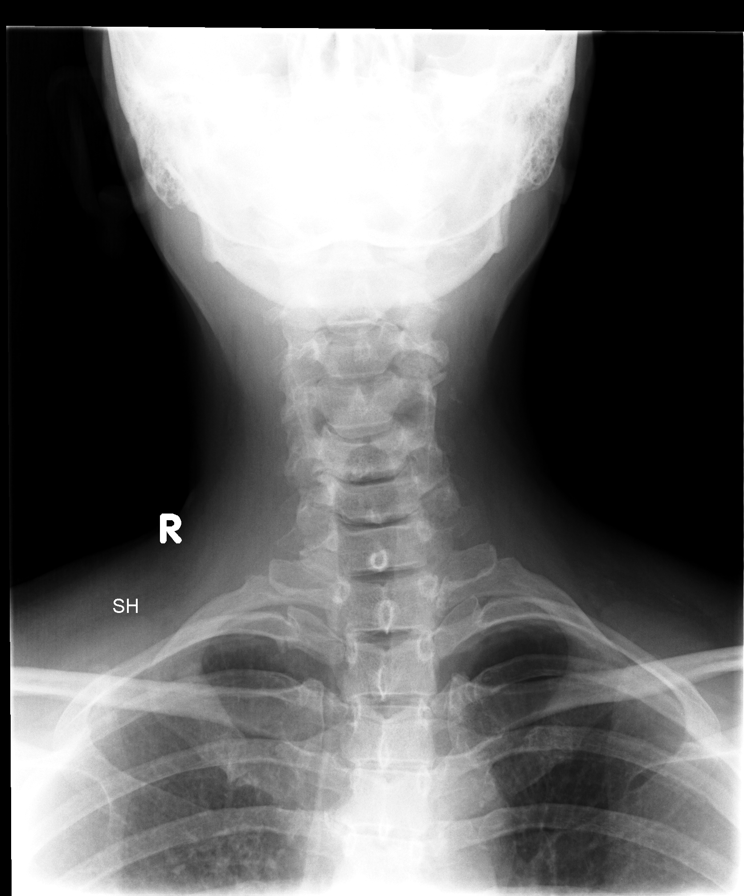

[view not recorded (7 of 8)]
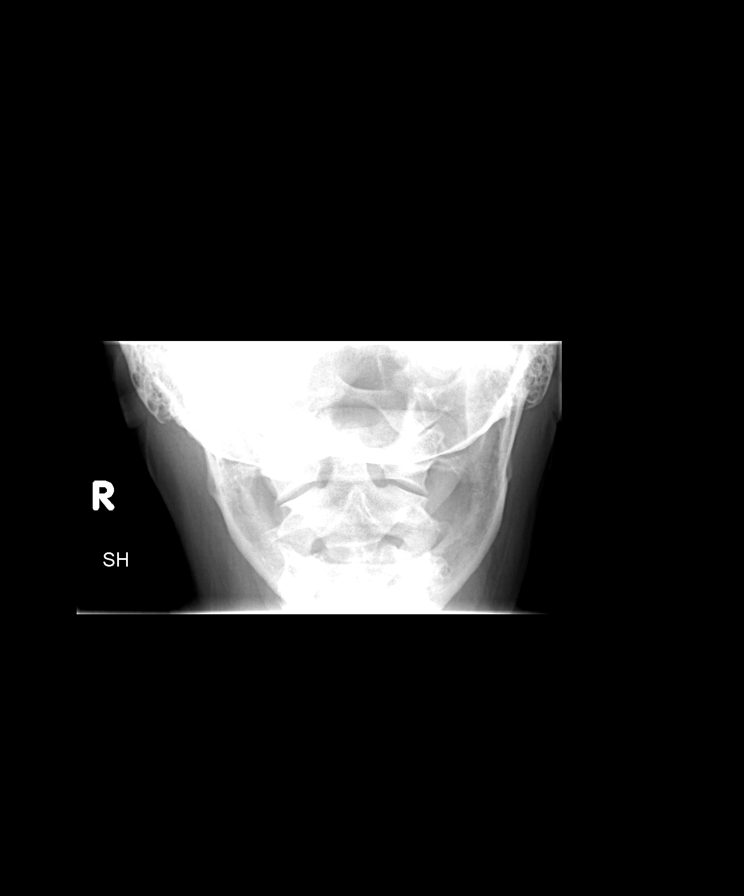

[view not recorded (8 of 8)]
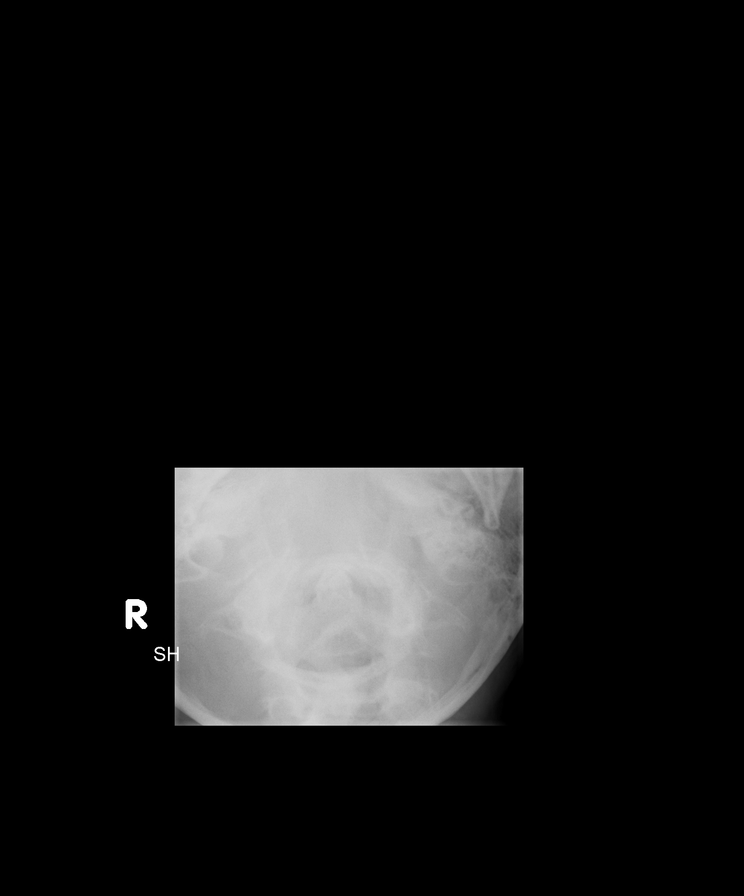

[8 of 8 positions shown; findings below may reference images not displayed]

FINDINGS: Reversal of the normal cervical lordosis.

The cervical spine visualize to C7-C1 on the lateral view.

No evidence of fracture or dislocation.  The vertebral body heights
and intervertebral disc spaces are maintained.  The dens appears
intact.  The lateral masses of C1 are symmetric.

No prevertebral soft tissue swelling.

Moderate multilevel degenerative changes, most prominent at C5-6.

Mild narrowing of the left C5-6 neural foramen.

No abnormal motion with flexion/extension.

Visualized lung apices are clear.
IMPRESSION: No fracture or dislocation is seen.

Moderate multilevel degenerative changes, most prominent at C5-6.

Mild narrowing of the left C5-6 neural foramen.

## 2012-12-04 ENCOUNTER — Other Ambulatory Visit: Payer: Self-pay | Admitting: Internal Medicine

## 2013-02-03 ENCOUNTER — Other Ambulatory Visit: Payer: Self-pay

## 2013-02-03 MED ORDER — TELMISARTAN 80 MG PO TABS: ORAL_TABLET | ORAL | Status: DC

## 2013-02-03 NOTE — Telephone Encounter (Signed)
Received faxed refill request for micardis 80 mg, pt last seen 12/20/11 and need follow up appointment. Will refill medication refilled once

## 2013-04-10 ENCOUNTER — Other Ambulatory Visit: Payer: Self-pay | Admitting: Internal Medicine

## 2013-04-30 ENCOUNTER — Encounter: Payer: Self-pay | Admitting: Internal Medicine

## 2013-04-30 ENCOUNTER — Ambulatory Visit (INDEPENDENT_AMBULATORY_CARE_PROVIDER_SITE_OTHER): Payer: Medicare Other | Admitting: Internal Medicine

## 2013-04-30 VITALS — BP 120/88 | HR 82 | Temp 99.0°F | Ht 63.5 in | Wt 158.1 lb

## 2013-04-30 DIAGNOSIS — J029 Acute pharyngitis, unspecified: Secondary | ICD-10-CM

## 2013-04-30 MED ORDER — LEVOFLOXACIN 250 MG PO TABS: 250.0000 mg | ORAL_TABLET | Freq: Every day | ORAL | Status: DC

## 2013-04-30 NOTE — Assessment & Plan Note (Signed)
Mild to mod, for antibx course,  to f/u any worsening symptoms or concerns 

## 2013-04-30 NOTE — Progress Notes (Signed)
  Subjective:    Patient ID: Michelle Kirby, female    DOB: 10-04-1950, 62 y.o.   MRN: [id number]  HPI   Here with 2-3 days acute onset fever, severe ST pain, pressure, headache, general weakness and malaise, but pt denies cough, chest pain, wheezing, increased sob or doe, orthopnea, PND, increased LE swelling, palpitations, dizziness or syncope. Son with proven strep earlier this wk and lives with her. Past Medical History  Diagnosis Date  . Depression   . Hyperlipidemia   . Hypertension   . LBP (low back pain)    Past Surgical History  Procedure Laterality Date  . Lumbar fusion  1997  . Breast biopsy      reports that she has quit smoking. She does not have any smokeless tobacco history on file. She reports that she does not drink alcohol or use illicit drugs. family history is not on file. No Known Allergies Current Outpatient Prescriptions on File Prior to Visit  Medication Sig Dispense Refill  . DULoxetine (CYMBALTA) 60 MG capsule Take 60 mg by mouth daily.        . DULoxetine HCl (CYMBALTA PO) Take by mouth. 90 mg per pt      . estrogen, conjugated,-medroxyprogesterone (PREMPRO) 0.625-2.5 MG per tablet Take 1 tablet by mouth daily.        . fentaNYL (DURAGESIC - DOSED MCG/HR) 12 MCG/HR Place 1 patch onto the skin every 3 (three) days.      . fentaNYL (DURAGESIC - DOSED MCG/HR) 50 MCG/HR Place 1 patch onto the skin every other day.        . nebivolol (BYSTOLIC) 5 MG tablet Take 1 tablet (5 mg total) by mouth daily.  70 tablet  0  . pregabalin (LYRICA) 100 MG capsule Take 100 mg by mouth 2 (two) times daily.        Marland Kitchen telmisartan (MICARDIS) 80 MG tablet TAKE 1 TABLET BY MOUTH EVERY DAY  30 tablet  0  . Armodafinil 150 MG tablet Take 1 tablet (150 mg total) by mouth daily.  30 tablet  5   No current facility-administered medications on file prior to visit.   Review of Systems All otherwise neg per pt     Objective:   Physical Exam BP 120/88  Pulse 82  Temp(Src) 99 F (37.2  C) (Oral)  Ht 5' 3.5" (1.613 m)  Wt 158 lb 2 oz (71.725 kg)  BMI 27.57 kg/m2  SpO2 96% VS noted, mild ill Constitutional: Pt appears well-developed and well-nourished.  HENT: Head: NCAT.  Right Ear: External ear normal.  Left Ear: External ear normal.  Eyes: Conjunctivae and EOM are normal. Pupils are equal, round, and reactive to light.  Bilat tm's with mild erythema.  Max sinus areas non tender.  Pharynx with severe erythema, no exudate Neck: Normal range of motion. Neck supple. with bilat submandib tender LA Cardiovascular: Normal rate and regular rhythm.   Pulmonary/Chest: Effort normal and breath sounds normal.  Neurological: Pt is alert. Not confused  Skin: Skin is warm. No erythema.  Psychiatric: Pt behavior is normal. Thought content normal.     Assessment & Plan:

## 2013-04-30 NOTE — Patient Instructions (Signed)
Please take all new medication as prescribed - the antibiotic  Please continue all other medications as before, and refills have been done if requested.

## 2013-05-08 ENCOUNTER — Ambulatory Visit (INDEPENDENT_AMBULATORY_CARE_PROVIDER_SITE_OTHER): Payer: Medicare Other

## 2013-05-08 DIAGNOSIS — Z23 Encounter for immunization: Secondary | ICD-10-CM

## 2013-05-19 ENCOUNTER — Other Ambulatory Visit: Payer: Self-pay | Admitting: Internal Medicine

## 2013-05-23 ENCOUNTER — Encounter: Payer: Self-pay | Admitting: Internal Medicine

## 2013-05-23 ENCOUNTER — Ambulatory Visit (INDEPENDENT_AMBULATORY_CARE_PROVIDER_SITE_OTHER): Payer: Medicare Other

## 2013-05-23 ENCOUNTER — Ambulatory Visit (INDEPENDENT_AMBULATORY_CARE_PROVIDER_SITE_OTHER): Payer: Medicare Other | Admitting: Internal Medicine

## 2013-05-23 VITALS — BP 136/88 | HR 68 | Temp 98.2°F | Resp 16 | Ht 63.0 in | Wt 158.0 lb

## 2013-05-23 DIAGNOSIS — E785 Hyperlipidemia, unspecified: Secondary | ICD-10-CM

## 2013-05-23 DIAGNOSIS — Z0000 Encounter for general adult medical examination without abnormal findings: Secondary | ICD-10-CM

## 2013-05-23 DIAGNOSIS — I10 Essential (primary) hypertension: Secondary | ICD-10-CM

## 2013-05-23 DIAGNOSIS — Z1231 Encounter for screening mammogram for malignant neoplasm of breast: Secondary | ICD-10-CM | POA: Insufficient documentation

## 2013-05-23 LAB — BASIC METABOLIC PANEL
CO2: 31 mEq/L (ref 19–32)
Chloride: 102 mEq/L (ref 96–112)
GFR: 70.32 mL/min (ref >60.00)
Potassium: 4.3 mEq/L (ref 3.5–5.1)
Sodium: 135 mEq/L (ref 135–145)

## 2013-05-23 LAB — LIPID PANEL
Cholesterol: 251 mg/dL — ABNORMAL HIGH (ref 0–200)
HDL: 63.9 mg/dL (ref >39.00)
Total CHOL/HDL Ratio: 4
Triglycerides: 263 mg/dL — ABNORMAL HIGH (ref 0.0–149.0)
VLDL: 52.6 mg/dL — ABNORMAL HIGH (ref 0.0–40.0)

## 2013-05-23 MED ORDER — TELMISARTAN 80 MG PO TABS: ORAL_TABLET | ORAL | Status: DC

## 2013-05-23 MED ORDER — NEBIVOLOL HCL 5 MG PO TABS: 5.0000 mg | ORAL_TABLET | Freq: Every day | ORAL | Status: DC

## 2013-05-23 NOTE — Assessment & Plan Note (Signed)
I will recheck her FLP and will consider the need for a statin

## 2013-05-23 NOTE — Patient Instructions (Signed)
Preventive Care for Adults, Female A healthy lifestyle and preventive care can promote health and wellness. Preventive health guidelines for women include the following key practices.  A routine yearly physical is a good way to check with your caregiver about your health and preventive screening. It is a chance to share any concerns and updates on your health, and to receive a thorough exam.  Visit your dentist for a routine exam and preventive care every 6 months. Brush your teeth twice a day and floss once a day. Good oral hygiene prevents tooth decay and gum disease.  The frequency of eye exams is based on your age, health, family medical history, use of contact lenses, and other factors. Follow your caregiver's recommendations for frequency of eye exams.  Eat a healthy diet. Foods like vegetables, fruits, whole grains, low-fat dairy products, and lean protein foods contain the nutrients you need without too many calories. Decrease your intake of foods high in solid fats, added sugars, and salt. Eat the right amount of calories for you.Get information about a proper diet from your caregiver, if necessary.  Regular physical exercise is one of the most important things you can do for your health. Most adults should get at least 150 minutes of moderate-intensity exercise (any activity that increases your heart rate and causes you to sweat) each week. In addition, most adults need muscle-strengthening exercises on 2 or more days a week.  Maintain a healthy weight. The body mass index (BMI) is a screening tool to identify possible weight problems. It provides an estimate of body fat based on height and weight. Your caregiver can help determine your BMI, and can help you achieve or maintain a healthy weight.For adults 20 years and older:  A BMI below 18.5 is considered underweight.  A BMI of 18.5 to 24.9 is normal.  A BMI of 25 to 29.9 is considered overweight.  A BMI of 30 and above is  considered obese.  Maintain normal blood lipids and cholesterol levels by exercising and minimizing your intake of saturated fat. Eat a balanced diet with plenty of fruit and vegetables. Blood tests for lipids and cholesterol should begin at age 20 and be repeated every 5 years. If your lipid or cholesterol levels are high, you are over 50, or you are at high risk for heart disease, you may need your cholesterol levels checked more frequently.Ongoing high lipid and cholesterol levels should be treated with medicines if diet and exercise are not effective.  If you smoke, find out from your caregiver how to quit. If you do not use tobacco, do not start.  Lung cancer screening is recommended for adults aged 55 80 years who are at high risk for developing lung cancer because of a history of smoking. Yearly low-dose computed tomography (CT) is recommended for people who have at least a 30-pack-year history of smoking and are a current smoker or have quit within the past 15 years. A pack year of smoking is smoking an average of 1 pack of cigarettes a day for 1 year (for example: 1 pack a day for 30 years or 2 packs a day for 15 years). Yearly screening should continue until the smoker has stopped smoking for at least 15 years. Yearly screening should also be stopped for people who develop a health problem that would prevent them from having lung cancer treatment.  If you are pregnant, do not drink alcohol. If you are breastfeeding, be very cautious about drinking alcohol. If you are   not pregnant and choose to drink alcohol, do not exceed 1 drink per day. One drink is considered to be 12 ounces (355 mL) of beer, 5 ounces (148 mL) of wine, or 1.5 ounces (44 mL) of liquor.  Avoid use of street drugs. Do not share needles with anyone. Ask for help if you need support or instructions about stopping the use of drugs.  High blood pressure causes heart disease and increases the risk of stroke. Your blood pressure  should be checked at least every 1 to 2 years. Ongoing high blood pressure should be treated with medicines if weight loss and exercise are not effective.  If you are 55 to 61 years old, ask your caregiver if you should take aspirin to prevent strokes.  Diabetes screening involves taking a blood sample to check your fasting blood sugar level. This should be done once every 3 years, after age 45, if you are within normal weight and without risk factors for diabetes. Testing should be considered at a younger age or be carried out more frequently if you are overweight and have at least 1 risk factor for diabetes.  Breast cancer screening is essential preventive care for women. You should practice "breast self-awareness." This means understanding the normal appearance and feel of your breasts and may include breast self-examination. Any changes detected, no matter how small, should be reported to a caregiver. Women in their 20s and 30s should have a clinical breast exam (CBE) by a caregiver as part of a regular health exam every 1 to 3 years. After age 40, women should have a CBE every year. Starting at age 40, women should consider having a mammography (breast X-ray test) every year. Women who have a family history of breast cancer should talk to their caregiver about genetic screening. Women at a high risk of breast cancer should talk to their caregivers about having magnetic resonance imaging (MRI) and a mammography every year.  Breast cancer gene (BRCA)-related cancer risk assessment is recommended for women who have family members with BRCA-related cancers. BRCA-related cancers include breast, ovarian, tubal, and peritoneal cancers. Having family members with these cancers may be associated with an increased risk for harmful changes (mutations) in the breast cancer genes BRCA1 and BRCA2. Results of the assessment will determine the need for genetic counseling and BRCA1 and BRCA2 testing.  The Pap test is  a screening test for cervical cancer. A Pap test can show cell changes on the cervix that might become cervical cancer if left untreated. A Pap test is a procedure in which cells are obtained and examined from the lower end of the uterus (cervix).  Women should have a Pap test starting at age 21.  Between ages 21 and 29, Pap tests should be repeated every 2 years.  Beginning at age 30, you should have a Pap test every 3 years as long as the past 3 Pap tests have been normal.  Some women have medical problems that increase the chance of getting cervical cancer. Talk to your caregiver about these problems. It is especially important to talk to your caregiver if a new problem develops soon after your last Pap test. In these cases, your caregiver may recommend more frequent screening and Pap tests.  The above recommendations are the same for women who have or have not gotten the vaccine for human papillomavirus (HPV).  If you had a hysterectomy for a problem that was not cancer or a condition that could lead to cancer, then   you no longer need Pap tests. Even if you no longer need a Pap test, a regular exam is a good idea to make sure no other problems are starting.  If you are between ages 65 and 70, and you have had normal Pap tests going back 10 years, you no longer need Pap tests. Even if you no longer need a Pap test, a regular exam is a good idea to make sure no other problems are starting.  If you have had past treatment for cervical cancer or a condition that could lead to cancer, you need Pap tests and screening for cancer for at least 20 years after your treatment.  If Pap tests have been discontinued, risk factors (such as a new sexual partner) need to be reassessed to determine if screening should be resumed.  The HPV test is an additional test that may be used for cervical cancer screening. The HPV test looks for the virus that can cause the cell changes on the cervix. The cells collected  during the Pap test can be tested for HPV. The HPV test could be used to screen women aged 30 years and older, and should be used in women of any age who have unclear Pap test results. After the age of 30, women should have HPV testing at the same frequency as a Pap test.  Colorectal cancer can be detected and often prevented. Most routine colorectal cancer screening begins at the age of 50 and continues through age 75. However, your caregiver may recommend screening at an earlier age if you have risk factors for colon cancer. On a yearly basis, your caregiver may provide home test kits to check for hidden blood in the stool. Use of a small camera at the end of a tube, to directly examine the colon (sigmoidoscopy or colonoscopy), can detect the earliest forms of colorectal cancer. Talk to your caregiver about this at age 50, when routine screening begins. Direct examination of the colon should be repeated every 5 to 10 years through age 75, unless early forms of pre-cancerous polyps or small growths are found.  Hepatitis C blood testing is recommended for all people born from 1945 through 1965 and any individual with known risks for hepatitis C.  Practice safe sex. Use condoms and avoid high-risk sexual practices to reduce the spread of sexually transmitted infections (STIs). STIs include gonorrhea, chlamydia, syphilis, trichomonas, herpes, HPV, and human immunodeficiency virus (HIV). Herpes, HIV, and HPV are viral illnesses that have no cure. They can result in disability, cancer, and death. Sexually active women aged 25 and younger should be checked for chlamydia. Older women with new or multiple partners should also be tested for chlamydia. Testing for other STIs is recommended if you are sexually active and at increased risk.  Osteoporosis is a disease in which the bones lose minerals and strength with aging. This can result in serious bone fractures. The risk of osteoporosis can be identified using a  bone density scan. Women ages 65 and over and women at risk for fractures or osteoporosis should discuss screening with their caregivers. Ask your caregiver whether you should take a calcium supplement or vitamin D to reduce the rate of osteoporosis.  Menopause can be associated with physical symptoms and risks. Hormone replacement therapy is available to decrease symptoms and risks. You should talk to your caregiver about whether hormone replacement therapy is right for you.  Use sunscreen. Apply sunscreen liberally and repeatedly throughout the day. You should seek shade   when your shadow is shorter than you. Protect yourself by wearing long sleeves, pants, a wide-brimmed hat, and sunglasses year round, whenever you are outdoors.  Once a month, do a whole body skin exam, using a mirror to look at the skin on your back. Notify your caregiver of new moles, moles that have irregular borders, moles that are larger than a pencil eraser, or moles that have changed in shape or color.  Stay current with required immunizations.  Influenza vaccine. All adults should be immunized every year.  Tetanus, diphtheria, and acellular pertussis (Td, Tdap) vaccine. Pregnant women should receive 1 dose of Tdap vaccine during each pregnancy. The dose should be obtained regardless of the length of time since the last dose. Immunization is preferred during the 27th to 36th week of gestation. An adult who has not previously received Tdap or who does not know her vaccine status should receive 1 dose of Tdap. This initial dose should be followed by tetanus and diphtheria toxoids (Td) booster doses every 10 years. Adults with an unknown or incomplete history of completing a 3-dose immunization series with Td-containing vaccines should begin or complete a primary immunization series including a Tdap dose. Adults should receive a Td booster every 10 years.  Varicella vaccine. An adult without evidence of immunity to varicella  should receive 2 doses or a second dose if she has previously received 1 dose. Pregnant females who do not have evidence of immunity should receive the first dose after pregnancy. This first dose should be obtained before leaving the health care facility. The second dose should be obtained 4 8 weeks after the first dose.  Human papillomavirus (HPV) vaccine. Females aged 13 26 years who have not received the vaccine previously should obtain the 3-dose series. The vaccine is not recommended for use in pregnant females. However, pregnancy testing is not needed before receiving a dose. If a female is found to be pregnant after receiving a dose, no treatment is needed. In that case, the remaining doses should be delayed until after the pregnancy. Immunization is recommended for any person with an immunocompromised condition through the age of 26 years if she did not get any or all doses earlier. During the 3-dose series, the second dose should be obtained 4 8 weeks after the first dose. The third dose should be obtained 24 weeks after the first dose and 16 weeks after the second dose.  Zoster vaccine. One dose is recommended for adults aged 60 years or older unless certain conditions are present.  Measles, mumps, and rubella (MMR) vaccine. Adults born before 1957 generally are considered immune to measles and mumps. Adults born in 1957 or later should have 1 or more doses of MMR vaccine unless there is a contraindication to the vaccine or there is laboratory evidence of immunity to each of the three diseases. A routine second dose of MMR vaccine should be obtained at least 28 days after the first dose for students attending postsecondary schools, health care workers, or international travelers. People who received inactivated measles vaccine or an unknown type of measles vaccine during 1963 1967 should receive 2 doses of MMR vaccine. People who received inactivated mumps vaccine or an unknown type of mumps vaccine  before 1979 and are at high risk for mumps infection should consider immunization with 2 doses of MMR vaccine. For females of childbearing age, rubella immunity should be determined. If there is no evidence of immunity, females who are not pregnant should be vaccinated. If there   is no evidence of immunity, females who are pregnant should delay immunization until after pregnancy. Unvaccinated health care workers born before 1957 who lack laboratory evidence of measles, mumps, or rubella immunity or laboratory confirmation of disease should consider measles and mumps immunization with 2 doses of MMR vaccine or rubella immunization with 1 dose of MMR vaccine.  Pneumococcal 13-valent conjugate (PCV13) vaccine. When indicated, a person who is uncertain of her immunization history and has no record of immunization should receive the PCV13 vaccine. An adult aged 19 years or older who has certain medical conditions and has not been previously immunized should receive 1 dose of PCV13 vaccine. This PCV13 should be followed with a dose of pneumococcal polysaccharide (PPSV23) vaccine. The PPSV23 vaccine dose should be obtained at least 8 weeks after the dose of PCV13 vaccine. An adult aged 19 years or older who has certain medical conditions and previously received 1 or more doses of PPSV23 vaccine should receive 1 dose of PCV13. The PCV13 vaccine dose should be obtained 1 or more years after the last PPSV23 vaccine dose.  Pneumococcal polysaccharide (PPSV23) vaccine. When PCV13 is also indicated, PCV13 should be obtained first. All adults aged 65 years and older should be immunized. An adult younger than age 65 years who has certain medical conditions should be immunized. Any person who resides in a nursing home or long-term care facility should be immunized. An adult smoker should be immunized. People with an immunocompromised condition and certain other conditions should receive both PCV13 and PPSV23 vaccines. People  with human immunodeficiency virus (HIV) infection should be immunized as soon as possible after diagnosis. Immunization during chemotherapy or radiation therapy should be avoided. Routine use of PPSV23 vaccine is not recommended for American Indians, [place] Natives, or people younger than 65 years unless there are medical conditions that require PPSV23 vaccine. When indicated, people who have unknown immunization and have no record of immunization should receive PPSV23 vaccine. One-time revaccination 5 years after the first dose of PPSV23 is recommended for people aged 19 64 years who have chronic kidney failure, nephrotic syndrome, asplenia, or immunocompromised conditions. People who received 1 2 doses of PPSV23 before age 65 years should receive another dose of PPSV23 vaccine at age 65 years or later if at least 5 years have passed since the previous dose. Doses of PPSV23 are not needed for people immunized with PPSV23 at or after age 65 years.  Meningococcal vaccine. Adults with asplenia or persistent complement component deficiencies should receive 2 doses of quadrivalent meningococcal conjugate (MenACWY-D) vaccine. The doses should be obtained at least 2 months apart. Microbiologists working with certain meningococcal bacteria, military recruits, people at risk during an outbreak, and people who travel to or live in countries with a high rate of meningitis should be immunized. A first-year college student up through age 21 years who is living in a residence hall should receive a dose if she did not receive a dose on or after her 16th birthday. Adults who have certain high-risk conditions should receive one or more doses of vaccine.  Hepatitis A vaccine. Adults who wish to be protected from this disease, have certain high-risk conditions, work with hepatitis A-infected animals, work in hepatitis A research labs, or travel to or work in countries with a high rate of hepatitis A should be immunized. Adults  who were previously unvaccinated and who anticipate close contact with an international adoptee during the first 60 days after arrival in the [place] from a country   with a high rate of hepatitis A should be immunized.  Hepatitis B vaccine. Adults who wish to be protected from this disease, have certain high-risk conditions, may be exposed to blood or other infectious body fluids, are household contacts or sex partners of hepatitis B positive people, are clients or workers in certain care facilities, or travel to or work in countries with a high rate of hepatitis B should be immunized.  Haemophilus influenzae type b (Hib) vaccine. A previously unvaccinated person with asplenia or sickle cell disease or having a scheduled splenectomy should receive 1 dose of Hib vaccine. Regardless of previous immunization, a recipient of a hematopoietic stem cell transplant should receive a 3-dose series 6 12 months after her successful transplant. Hib vaccine is not recommended for adults with HIV infection. Preventive Services / Frequency Ages 19 to 39  Blood pressure check.** / Every 1 to 2 years.  Lipid and cholesterol check.** / Every 5 years beginning at age 20.  Clinical breast exam.** / Every 3 years for women in their 20s and 30s.  BRCA-related cancer risk assessment.** / For women who have family members with a BRCA-related cancer (breast, ovarian, tubal, or peritoneal cancers).  Pap test.** / Every 2 years from ages 21 through 29. Every 3 years starting at age 30 through age 65 or 70 with a history of 3 consecutive normal Pap tests.  HPV screening.** / Every 3 years from ages 30 through ages 65 to 70 with a history of 3 consecutive normal Pap tests.  Hepatitis C blood test.** / For any individual with known risks for hepatitis C.  Skin self-exam. / Monthly.  Influenza vaccine. / Every year.  Tetanus, diphtheria, and acellular pertussis (Tdap, Td) vaccine.** / Consult your caregiver. Pregnant  women should receive 1 dose of Tdap vaccine during each pregnancy. 1 dose of Td every 10 years.  Varicella vaccine.** / Consult your caregiver. Pregnant females who do not have evidence of immunity should receive the first dose after pregnancy.  HPV vaccine. / 3 doses over 6 months, if 26 and younger. The vaccine is not recommended for use in pregnant females. However, pregnancy testing is not needed before receiving a dose.  Measles, mumps, rubella (MMR) vaccine.** / You need at least 1 dose of MMR if you were born in 1957 or later. You may also need a 2nd dose. For females of childbearing age, rubella immunity should be determined. If there is no evidence of immunity, females who are not pregnant should be vaccinated. If there is no evidence of immunity, females who are pregnant should delay immunization until after pregnancy.  Pneumococcal 13-valent conjugate (PCV13) vaccine.** / Consult your caregiver.  Pneumococcal polysaccharide (PPSV23) vaccine.** / 1 to 2 doses if you smoke cigarettes or if you have certain conditions.  Meningococcal vaccine.** / 1 dose if you are age 19 to 21 years and a first-year college student living in a residence hall, or have one of several medical conditions, you need to get vaccinated against meningococcal disease. You may also need additional booster doses.  Hepatitis A vaccine.** / Consult your caregiver.  Hepatitis B vaccine.** / Consult your caregiver.  Haemophilus influenzae type b (Hib) vaccine.** / Consult your caregiver. Ages 40 to 64  Blood pressure check.** / Every 1 to 2 years.  Lipid and cholesterol check.** / Every 5 years beginning at age 20.  Lung cancer screening. / Every year if you are aged 55 80 years and have a 30-pack-year history of smoking and   currently smoke or have quit within the past 15 years. Yearly screening is stopped once you have quit smoking for at least 15 years or develop a health problem that would prevent you from having  lung cancer treatment.  Clinical breast exam.** / Every year after age 40.  BRCA-related cancer risk assessment.** / For women who have family members with a BRCA-related cancer (breast, ovarian, tubal, or peritoneal cancers).  Mammogram.** / Every year beginning at age 40 and continuing for as long as you are in good health. Consult with your caregiver.  Pap test.** / Every 3 years starting at age 30 through age 65 or 70 with a history of 3 consecutive normal Pap tests.  HPV screening.** / Every 3 years from ages 30 through ages 65 to 70 with a history of 3 consecutive normal Pap tests.  Fecal occult blood test (FOBT) of stool. / Every year beginning at age 50 and continuing until age 75. You may not need to do this test if you get a colonoscopy every 10 years.  Flexible sigmoidoscopy or colonoscopy.** / Every 5 years for a flexible sigmoidoscopy or every 10 years for a colonoscopy beginning at age 50 and continuing until age 75.  Hepatitis C blood test.** / For all people born from 1945 through 1965 and any individual with known risks for hepatitis C.  Skin self-exam. / Monthly.  Influenza vaccine. / Every year.  Tetanus, diphtheria, and acellular pertussis (Tdap/Td) vaccine.** / Consult your caregiver. Pregnant women should receive 1 dose of Tdap vaccine during each pregnancy. 1 dose of Td every 10 years.  Varicella vaccine.** / Consult your caregiver. Pregnant females who do not have evidence of immunity should receive the first dose after pregnancy.  Zoster vaccine.** / 1 dose for adults aged 60 years or older.  Measles, mumps, rubella (MMR) vaccine.** / You need at least 1 dose of MMR if you were born in 1957 or later. You may also need a 2nd dose. For females of childbearing age, rubella immunity should be determined. If there is no evidence of immunity, females who are not pregnant should be vaccinated. If there is no evidence of immunity, females who are pregnant should delay  immunization until after pregnancy.  Pneumococcal 13-valent conjugate (PCV13) vaccine.** / Consult your caregiver.  Pneumococcal polysaccharide (PPSV23) vaccine.** / 1 to 2 doses if you smoke cigarettes or if you have certain conditions.  Meningococcal vaccine.** / Consult your caregiver.  Hepatitis A vaccine.** / Consult your caregiver.  Hepatitis B vaccine.** / Consult your caregiver.  Haemophilus influenzae type b (Hib) vaccine.** / Consult your caregiver. Ages 65 and over  Blood pressure check.** / Every 1 to 2 years.  Lipid and cholesterol check.** / Every 5 years beginning at age 20.  Lung cancer screening. / Every year if you are aged 55 80 years and have a 30-pack-year history of smoking and currently smoke or have quit within the past 15 years. Yearly screening is stopped once you have quit smoking for at least 15 years or develop a health problem that would prevent you from having lung cancer treatment.  Clinical breast exam.** / Every year after age 40.  BRCA-related cancer risk assessment.** / For women who have family members with a BRCA-related cancer (breast, ovarian, tubal, or peritoneal cancers).  Mammogram.** / Every year beginning at age 40 and continuing for as long as you are in good health. Consult with your caregiver.  Pap test.** / Every 3 years starting at age   30 through age 65 or 70 with a 3 consecutive normal Pap tests. Testing can be stopped between 65 and 70 with 3 consecutive normal Pap tests and no abnormal Pap or HPV tests in the past 10 years.  HPV screening.** / Every 3 years from ages 30 through ages 65 or 70 with a history of 3 consecutive normal Pap tests. Testing can be stopped between 65 and 70 with 3 consecutive normal Pap tests and no abnormal Pap or HPV tests in the past 10 years.  Fecal occult blood test (FOBT) of stool. / Every year beginning at age 50 and continuing until age 75. You may not need to do this test if you get a colonoscopy  every 10 years.  Flexible sigmoidoscopy or colonoscopy.** / Every 5 years for a flexible sigmoidoscopy or every 10 years for a colonoscopy beginning at age 50 and continuing until age 75.  Hepatitis C blood test.** / For all people born from 1945 through 1965 and any individual with known risks for hepatitis C.  Osteoporosis screening.** / A one-time screening for women ages 65 and over and women at risk for fractures or osteoporosis.  Skin self-exam. / Monthly.  Influenza vaccine. / Every year.  Tetanus, diphtheria, and acellular pertussis (Tdap/Td) vaccine.** / 1 dose of Td every 10 years.  Varicella vaccine.** / Consult your caregiver.  Zoster vaccine.** / 1 dose for adults aged 60 years or older.  Pneumococcal 13-valent conjugate (PCV13) vaccine.** / Consult your caregiver.  Pneumococcal polysaccharide (PPSV23) vaccine.** / 1 dose for all adults aged 65 years and older.  Meningococcal vaccine.** / Consult your caregiver.  Hepatitis A vaccine.** / Consult your caregiver.  Hepatitis B vaccine.** / Consult your caregiver.  Haemophilus influenzae type b (Hib) vaccine.** / Consult your caregiver. ** Family history and personal history of risk and conditions may change your caregiver's recommendations. Document Released: 08/22/2001 Document Revised: 10/21/2012 Document Reviewed: 11/21/2010 [employer] Patient Information 2014 [employer]. Hypertension As your heart beats, it forces blood through your arteries. This force is your blood pressure. If the pressure is too high, it is called hypertension (HTN) or high blood pressure. HTN is dangerous because you may have it and not know it. High blood pressure may mean that your heart has to work harder to pump blood. Your arteries may be narrow or stiff. The extra work puts you at risk for heart disease, stroke, and other problems.  Blood pressure consists of two numbers, a higher number over a lower, 110/72, for example. It is stated as  "110 over 72." The ideal is below 120 for the top number (systolic) and under 80 for the bottom (diastolic). Write down your blood pressure today. You should pay close attention to your blood pressure if you have certain conditions such as:  Heart failure.  Prior heart attack.  Diabetes  Chronic kidney disease.  Prior stroke.  Multiple risk factors for heart disease. To see if you have HTN, your blood pressure should be measured while you are seated with your arm held at the level of the heart. It should be measured at least twice. A one-time elevated blood pressure reading (especially in the Emergency Department) does not mean that you need treatment. There may be conditions in which the blood pressure is different between your right and left arms. It is important to see your caregiver soon for a recheck. Most people have essential hypertension which means that there is not a specific cause. This type of high blood   pressure may be lowered by changing lifestyle factors such as:  Stress.  Smoking.  Lack of exercise.  Excessive weight.  Drug/tobacco/alcohol use.  Eating less salt. Most people do not have symptoms from high blood pressure until it has caused damage to the body. Effective treatment can often prevent, delay or reduce that damage. TREATMENT  When a cause has been identified, treatment for high blood pressure is directed at the cause. There are a large number of medications to treat HTN. These fall into several categories, and your caregiver will help you select the medicines that are best for you. Medications may have side effects. You should review side effects with your caregiver. If your blood pressure stays high after you have made lifestyle changes or started on medicines,   Your medication(s) may need to be changed.  Other problems may need to be addressed.  Be certain you understand your prescriptions, and know how and when to take your medicine.  Be sure to  follow up with your caregiver within the time frame advised (usually within two weeks) to have your blood pressure rechecked and to review your medications.  If you are taking more than one medicine to lower your blood pressure, make sure you know how and at what times they should be taken. Taking two medicines at the same time can result in blood pressure that is too low. SEEK IMMEDIATE MEDICAL CARE IF:  You develop a severe headache, blurred or changing vision, or confusion.  You have unusual weakness or numbness, or a faint feeling.  You have severe chest or abdominal pain, vomiting, or breathing problems. MAKE SURE YOU:   Understand these instructions.  Will watch your condition.  Will get help right away if you are not doing well or get worse. Document Released: 06/26/2005 Document Revised: 09/18/2011 Document Reviewed: 02/14/2008 [employer] Patient Information 2014 [employer].  

## 2013-05-23 NOTE — Progress Notes (Signed)
  Subjective:    Patient ID: Michelle Kirby, female    DOB: 1950/12/29, 62 y.o.   MRN: [id number]  Hypertension This is a chronic problem. The current episode started more than 1 year ago. The problem is unchanged. The problem is controlled. Pertinent negatives include no anxiety, blurred vision, chest pain, headaches, malaise/fatigue, neck pain, orthopnea, palpitations, peripheral edema, PND, shortness of breath or sweats. There are no associated agents to hypertension. Past treatments include angiotensin blockers and beta blockers. The current treatment provides moderate improvement. Compliance problems include exercise and diet.       Review of Systems  Constitutional: Negative.  Negative for fever, chills, malaise/fatigue, diaphoresis, appetite change and fatigue.  HENT: Negative.   Eyes: Negative.  Negative for blurred vision.  Respiratory: Negative.  Negative for cough, chest tightness, shortness of breath, wheezing and stridor.   Cardiovascular: Negative.  Negative for chest pain, palpitations, orthopnea, leg swelling and PND.  Gastrointestinal: Negative.  Negative for vomiting, abdominal pain, diarrhea, constipation and blood in stool.  Endocrine: Negative.   Genitourinary: Negative.   Musculoskeletal: Negative.  Negative for neck pain.  Skin: Negative.   Allergic/Immunologic: Negative.   Neurological: Negative for headaches.  Hematological: Negative.  Negative for adenopathy. Does not bruise/bleed easily.  Psychiatric/Behavioral: Negative.        Objective:   Physical Exam  Vitals reviewed. Constitutional: She is oriented to person, place, and time. She appears well-developed and well-nourished. No distress.  HENT:  Head: Normocephalic and atraumatic.  Mouth/Throat: Oropharynx is clear and moist. No oropharyngeal exudate.  Eyes: Conjunctivae are normal. Right eye exhibits no discharge. Left eye exhibits no discharge. No scleral icterus.  Neck: Normal range of motion. Neck  supple. No JVD present. No tracheal deviation present. No thyromegaly present.  Cardiovascular: Normal rate, regular rhythm, normal heart sounds and intact distal pulses.  Exam reveals no gallop and no friction rub.   No murmur heard. Pulmonary/Chest: Effort normal and breath sounds normal. No stridor. No respiratory distress. She has no wheezes. She has no rales. She exhibits no tenderness.  Abdominal: Soft. Bowel sounds are normal. She exhibits no distension and no mass. There is no tenderness. There is no rebound and no guarding.  Musculoskeletal: Normal range of motion. She exhibits no edema and no tenderness.  Lymphadenopathy:    She has no cervical adenopathy.  Neurological: She is oriented to person, place, and time.  Skin: Skin is warm and dry. No rash noted. She is not diaphoretic. No erythema. No pallor.  Psychiatric: She has a normal mood and affect. Her behavior is normal. Judgment and thought content normal.     Lab Results  Component Value Date   WBC 5.9 07/25/2011   HGB 13.8 07/25/2011   HCT 39.5 07/25/2011   PLT 298.0 07/25/2011   GLUCOSE 96 07/25/2011   CHOL 254* 07/25/2011   TRIG 160.0* 07/25/2011   HDL 67.30 07/25/2011   LDLDIRECT 156.1 07/25/2011   ALT 16 07/25/2011   AST 13 07/25/2011   NA 137 07/25/2011   K 4.2 07/25/2011   CL 102 07/25/2011   CREATININE 0.8 07/25/2011   BUN 13 07/25/2011   CO2 28 07/25/2011   TSH 1.18 07/25/2011   HGBA1C 5.4 02/23/2011       Assessment & Plan:

## 2013-05-23 NOTE — Progress Notes (Signed)
Pre visit review using our clinic review tool, if applicable. No additional management support is needed unless otherwise documented below in the visit note. 

## 2013-05-23 NOTE — Assessment & Plan Note (Signed)
Her BP is well controlled Today I will check her lytes and renal function 

## 2013-05-23 NOTE — Assessment & Plan Note (Signed)
Exam done Vaccines were reviewed Labs ordered She was referred for a mammo Pt ed material was given

## 2013-05-26 ENCOUNTER — Encounter: Payer: Self-pay | Admitting: Internal Medicine

## 2013-11-06 ENCOUNTER — Other Ambulatory Visit: Payer: Self-pay | Admitting: Pain Medicine

## 2013-11-06 DIAGNOSIS — M545 Low back pain, unspecified: Secondary | ICD-10-CM

## 2013-11-10 ENCOUNTER — Other Ambulatory Visit: Payer: Medicare Other

## 2013-11-12 ENCOUNTER — Other Ambulatory Visit: Payer: Self-pay | Admitting: Pain Medicine

## 2013-11-12 DIAGNOSIS — M5137 Other intervertebral disc degeneration, lumbosacral region: Secondary | ICD-10-CM

## 2013-11-12 DIAGNOSIS — M47817 Spondylosis without myelopathy or radiculopathy, lumbosacral region: Secondary | ICD-10-CM

## 2013-11-12 DIAGNOSIS — M545 Low back pain, unspecified: Secondary | ICD-10-CM

## 2013-11-12 DIAGNOSIS — M961 Postlaminectomy syndrome, not elsewhere classified: Secondary | ICD-10-CM

## 2013-11-13 ENCOUNTER — Ambulatory Visit
Admission: RE | Admit: 2013-11-13 | Discharge: 2013-11-13 | Disposition: A | Discharge status: Home / Self Care | Payer: Medicare Other | Source: Ambulatory Visit | Attending: Pain Medicine | Admitting: Pain Medicine

## 2013-11-13 DIAGNOSIS — M545 Low back pain, unspecified: Secondary | ICD-10-CM

## 2013-11-13 DIAGNOSIS — M47817 Spondylosis without myelopathy or radiculopathy, lumbosacral region: Secondary | ICD-10-CM

## 2013-11-13 DIAGNOSIS — M5137 Other intervertebral disc degeneration, lumbosacral region: Secondary | ICD-10-CM

## 2013-11-13 DIAGNOSIS — M961 Postlaminectomy syndrome, not elsewhere classified: Secondary | ICD-10-CM

## 2013-11-13 MED ORDER — IOHEXOL 300 MG/ML  SOLN
100.0000 mL | Freq: Once | INTRAMUSCULAR | Status: AC | PRN
  Administered 2013-11-13: 100 mL via INTRAVENOUS

## 2013-11-29 ENCOUNTER — Emergency Department (INDEPENDENT_AMBULATORY_CARE_PROVIDER_SITE_OTHER)
Admission: EM | Admit: 2013-11-29 | Discharge: 2013-11-29 | Disposition: A | Discharge status: Home / Self Care | Payer: Medicare Other | Source: Home / Self Care | Attending: Family Medicine | Admitting: Family Medicine

## 2013-11-29 DIAGNOSIS — L237 Allergic contact dermatitis due to plants, except food: Secondary | ICD-10-CM

## 2013-11-29 DIAGNOSIS — L255 Unspecified contact dermatitis due to plants, except food: Secondary | ICD-10-CM

## 2013-11-29 MED ORDER — METHYLPREDNISOLONE ACETATE 40 MG/ML IJ SUSP
80.0000 mg | Freq: Once | INTRAMUSCULAR | Status: AC
  Administered 2013-11-29: 80 mg via INTRAMUSCULAR

## 2013-11-29 MED ORDER — METHYLPREDNISOLONE ACETATE 80 MG/ML IJ SUSP
INTRAMUSCULAR | Status: AC
  Filled 2013-11-29: qty 1

## 2013-11-29 MED ORDER — TRIAMCINOLONE ACETONIDE 40 MG/ML IJ SUSP
INTRAMUSCULAR | Status: AC
  Filled 2013-11-29: qty 1

## 2013-11-29 MED ORDER — FLUTICASONE PROPIONATE 0.05 % EX CREA: TOPICAL_CREAM | Freq: Two times a day (BID) | CUTANEOUS | Status: DC

## 2013-11-29 MED ORDER — TRIAMCINOLONE ACETONIDE 40 MG/ML IJ SUSP
40.0000 mg | Freq: Once | INTRAMUSCULAR | Status: AC
  Administered 2013-11-29: 40 mg via INTRAMUSCULAR

## 2013-11-29 NOTE — ED Provider Notes (Signed)
CSN: [id number]     Arrival date & time 11/29/13  1425 History   First MD Initiated Contact with Patient 11/29/13 1511     No chief complaint on file.  (Consider location/radiation/quality/duration/timing/severity/associated sxs/prior Treatment) Patient is a 63 y.o. female presenting with rash. The history is provided by the patient.  Rash Location:  Face and torso Facial rash location:  L cheek Torso rash location:  L chest Quality: itchiness and redness   Severity:  Mild Onset quality:  Gradual Duration:  2 days Progression:  Spreading Chronicity:  New Context: plant contact   Ineffective treatments:  Anti-itch cream Associated symptoms: no nausea, no sore throat, no throat swelling, no tongue swelling, not vomiting and not wheezing     Past Medical History  Diagnosis Date  . Depression   . Hyperlipidemia   . Hypertension   . LBP (low back pain)    Past Surgical History  Procedure Laterality Date  . Lumbar fusion  1997  . Breast biopsy     Family History  Problem Relation Age of Onset  . Cancer Mother   . Hypertension Father   . Heart disease Father   . Hypertension Sister   . Hypertension Brother   . Cancer Brother   . Alcohol abuse Neg Hx   . COPD Neg Hx   . Drug abuse Neg Hx   . Early death Neg Hx   . Hyperlipidemia Neg Hx   . Kidney disease Neg Hx   . Stroke Neg Hx   . Hypertension Brother   . Hypertension Sister    History  Substance Use Topics  . Smoking status: Former Games developermoker  . Smokeless tobacco: Never Used  . Alcohol Use: No   OB History   Grav Para Term Preterm Abortions TAB SAB Ect Mult Living                 Review of Systems  Constitutional: Negative.   HENT: Negative for sore throat.   Respiratory: Negative for wheezing.   Gastrointestinal: Negative for nausea and vomiting.  Musculoskeletal: Negative.   Skin: Positive for rash.    Allergies  Review of patient's allergies indicates no known allergies.  Home Medications   Prior  to Admission medications   Medication Sig Start Date End Date Taking? Authorizing Provider  Armodafinil 150 MG tablet Take 1 tablet (150 mg total) by mouth daily. 10/12/10 05/23/13  Etta Grandchildhomas L Jones, MD  DULoxetine (CYMBALTA) 60 MG capsule Take 60 mg by mouth daily.      Historical Provider, MD  estrogen, conjugated,-medroxyprogesterone (PREMPRO) 0.625-2.5 MG per tablet Take 1 tablet by mouth daily.      Historical Provider, MD  fentaNYL (DURAGESIC - DOSED MCG/HR) 75 MCG/HR Place 75 mcg onto the skin every other day.    Historical Provider, MD  methocarbamol (ROBAXIN) 500 MG tablet Take 500 mg by mouth every 8 (eight) hours.    Historical Provider, MD  nebivolol (BYSTOLIC) 5 MG tablet Take 1 tablet (5 mg total) by mouth daily. 05/23/13   Etta Grandchildhomas L Jones, MD  pregabalin (LYRICA) 100 MG capsule Take 100 mg by mouth 2 (two) times daily.      Historical Provider, MD  tapentadol (NUCYNTA) 50 MG TABS tablet Take by mouth 4 (four) times daily as needed.    Historical Provider, MD  telmisartan (MICARDIS) 80 MG tablet TAKE 1 TABLET BY MOUTH EVERY DAY 05/23/13   Etta Grandchildhomas L Jones, MD   BP 149/91  Pulse 88  Temp(Src)  99 F (37.2 C) (Oral)  Resp 16  SpO2 100% Physical Exam  Nursing note and vitals reviewed. Constitutional: She is oriented to person, place, and time. She appears well-developed and well-nourished.  HENT:  Head: Normocephalic.  Mouth/Throat: Oropharynx is clear and moist.  Neck: Normal range of motion. Neck supple.  Pulmonary/Chest: Breath sounds normal.  Abdominal: Soft. Bowel sounds are normal.  Lymphadenopathy:    She has no cervical adenopathy.  Neurological: She is alert and oriented to person, place, and time.  Skin: Skin is warm and dry.  Papulovesicular patchy rash as noted , c/w cd.    ED Course  Procedures (including critical care time) Labs Review Labs Reviewed - No data to display  Imaging Review No results found.   MDM   1. Contact dermatitis due to poison ivy         Linna Hoff, MD 11/29/13 1534

## 2013-12-24 ENCOUNTER — Other Ambulatory Visit: Payer: Self-pay | Admitting: Obstetrics and Gynecology

## 2013-12-24 DIAGNOSIS — R928 Other abnormal and inconclusive findings on diagnostic imaging of breast: Secondary | ICD-10-CM

## 2014-01-02 ENCOUNTER — Ambulatory Visit
Admission: RE | Admit: 2014-01-02 | Discharge: 2014-01-02 | Disposition: A | Discharge status: Home / Self Care | Payer: Medicare Other | Source: Ambulatory Visit | Attending: Obstetrics and Gynecology | Admitting: Obstetrics and Gynecology

## 2014-01-02 DIAGNOSIS — R928 Other abnormal and inconclusive findings on diagnostic imaging of breast: Secondary | ICD-10-CM

## 2014-02-18 ENCOUNTER — Ambulatory Visit (INDEPENDENT_AMBULATORY_CARE_PROVIDER_SITE_OTHER): Payer: Medicare Other | Admitting: Internal Medicine

## 2014-02-18 ENCOUNTER — Encounter: Payer: Self-pay | Admitting: Internal Medicine

## 2014-02-18 VITALS — BP 120/86 | HR 91 | Temp 98.6°F | Resp 16 | Ht 63.0 in | Wt 161.8 lb

## 2014-02-18 DIAGNOSIS — F341 Dysthymic disorder: Secondary | ICD-10-CM

## 2014-02-18 DIAGNOSIS — F1910 Other psychoactive substance abuse, uncomplicated: Secondary | ICD-10-CM

## 2014-02-18 DIAGNOSIS — J020 Streptococcal pharyngitis: Secondary | ICD-10-CM

## 2014-02-18 DIAGNOSIS — G8929 Other chronic pain: Secondary | ICD-10-CM

## 2014-02-18 MED ORDER — AMOXICILLIN 875 MG PO TABS: 875.0000 mg | ORAL_TABLET | Freq: Two times a day (BID) | ORAL | Status: AC

## 2014-02-18 NOTE — Progress Notes (Signed)
Pre visit review using our clinic review tool, if applicable. No additional management support is needed unless otherwise documented below in the visit note. 

## 2014-02-18 NOTE — Progress Notes (Signed)
Subjective:    Patient ID: Michelle Kirby, female    DOB: September 05, 1950, 63 y.o.   MRN: [id number]  Sore Throat  This is a new problem. The current episode started in the past 7 days. The problem has been gradually worsening. Neither side of throat is experiencing more pain than the other. The maximum temperature recorded prior to her arrival was 100 - 100.9 F. The pain is mild. Pertinent negatives include no abdominal pain, congestion, coughing, diarrhea, drooling, ear discharge, ear pain, headaches, hoarse voice, plugged ear sensation, neck pain, shortness of breath, stridor, swollen glands, trouble swallowing or vomiting. She has had no exposure to mono. She has tried nothing for the symptoms. The treatment provided no relief.      Review of Systems  Constitutional: Positive for chills and fatigue. Negative for fever, diaphoresis, activity change, appetite change and unexpected weight change.  HENT: Positive for sore throat. Negative for congestion, drooling, ear discharge, ear pain, hoarse voice, rhinorrhea, sinus pressure, tinnitus, trouble swallowing and voice change.   Eyes: Negative.   Respiratory: Negative.  Negative for cough, choking, chest tightness, shortness of breath and stridor.   Cardiovascular: Negative.  Negative for chest pain, palpitations and leg swelling.  Gastrointestinal: Negative.  Negative for vomiting, abdominal pain, diarrhea and blood in stool.  Endocrine: Negative.   Genitourinary: Negative.   Musculoskeletal: Negative.  Negative for neck pain.  Skin: Negative.  Negative for rash.  Allergic/Immunologic: Negative.   Neurological: Negative.  Negative for headaches.  Hematological: Negative.  Negative for adenopathy. Does not bruise/bleed easily.  Psychiatric/Behavioral: Negative.        Objective:   Physical Exam  Vitals reviewed. Constitutional: She is oriented to person, place, and time. She appears well-developed and well-nourished.  Non-toxic  appearance. She does not have a sickly appearance. She does not appear ill. No distress.  HENT:  Right Ear: Hearing, tympanic membrane, external ear and ear canal normal.  Left Ear: Hearing, tympanic membrane, external ear and ear canal normal.  Nose: No mucosal edema or rhinorrhea. Right sinus exhibits no maxillary sinus tenderness and no frontal sinus tenderness. Left sinus exhibits no maxillary sinus tenderness and no frontal sinus tenderness.  Mouth/Throat: Mucous membranes are normal. Mucous membranes are not pale, not dry and not cyanotic. No oral lesions. No trismus in the jaw. No uvula swelling. Posterior oropharyngeal erythema present. No oropharyngeal exudate, posterior oropharyngeal edema or tonsillar abscesses.  Eyes: Conjunctivae are normal. Right eye exhibits no discharge. Left eye exhibits no discharge. No scleral icterus.  Neck: Normal range of motion. Neck supple. No JVD present. No tracheal deviation present. No thyromegaly present.  Cardiovascular: Normal rate, regular rhythm, normal heart sounds and intact distal pulses.  Exam reveals no gallop and no friction rub.   No murmur heard. Pulmonary/Chest: Effort normal and breath sounds normal. No stridor. No respiratory distress. She has no wheezes. She has no rales. She exhibits no tenderness.  Abdominal: Soft. Bowel sounds are normal. She exhibits no distension and no mass. There is no tenderness. There is no rebound and no guarding.  Musculoskeletal: Normal range of motion. She exhibits no edema and no tenderness.  Lymphadenopathy:    She has no cervical adenopathy.  Neurological: She is oriented to person, place, and time.  Skin: Skin is warm and dry. No rash noted. No erythema. No pallor.  Psychiatric: She has a normal mood and affect. Her behavior is normal. Judgment and thought content normal.     Lab Results  Component Value Date   WBC 5.9 07/25/2011   HGB 13.8 07/25/2011   HCT 39.5 07/25/2011   PLT 298.0 07/25/2011    GLUCOSE 82 05/23/2013   CHOL 251* 05/23/2013   TRIG 263.0* 05/23/2013   HDL 63.90 05/23/2013   LDLDIRECT 159.8 05/23/2013   ALT 16 07/25/2011   AST 13 07/25/2011   NA 135 05/23/2013   K 4.3 05/23/2013   CL 102 05/23/2013   CREATININE 0.9 05/23/2013   BUN 8 05/23/2013   CO2 31 05/23/2013   TSH 0.90 05/23/2013   HGBA1C 5.4 02/23/2011       Assessment & Plan:

## 2014-02-18 NOTE — Patient Instructions (Signed)

## 2014-02-19 ENCOUNTER — Encounter: Payer: Self-pay | Admitting: Internal Medicine

## 2014-02-19 NOTE — Assessment & Plan Note (Signed)
This appears to be strep Will treat with amoxil

## 2014-02-23 ENCOUNTER — Ambulatory Visit (INDEPENDENT_AMBULATORY_CARE_PROVIDER_SITE_OTHER)
Admission: RE | Admit: 2014-02-23 | Discharge: 2014-02-23 | Disposition: A | Discharge status: Home / Self Care | Payer: Medicare Other | Source: Ambulatory Visit | Attending: Internal Medicine | Admitting: Internal Medicine

## 2014-02-23 ENCOUNTER — Encounter: Payer: Self-pay | Admitting: Internal Medicine

## 2014-02-23 ENCOUNTER — Other Ambulatory Visit (INDEPENDENT_AMBULATORY_CARE_PROVIDER_SITE_OTHER): Payer: Medicare Other

## 2014-02-23 ENCOUNTER — Ambulatory Visit (INDEPENDENT_AMBULATORY_CARE_PROVIDER_SITE_OTHER): Payer: Medicare Other | Admitting: Internal Medicine

## 2014-02-23 VITALS — BP 120/86 | HR 80 | Temp 98.5°F | Wt 162.0 lb

## 2014-02-23 DIAGNOSIS — R51 Headache: Secondary | ICD-10-CM

## 2014-02-23 DIAGNOSIS — R519 Headache, unspecified: Secondary | ICD-10-CM

## 2014-02-23 DIAGNOSIS — J029 Acute pharyngitis, unspecified: Secondary | ICD-10-CM

## 2014-02-23 LAB — CBC WITH DIFFERENTIAL/PLATELET
BASOS PCT: 0.9 % (ref 0.0–3.0)
Basophils Absolute: 0.1 10*3/uL (ref 0.0–0.1)
EOS ABS: 0.2 10*3/uL (ref 0.0–0.7)
Eosinophils Relative: 2.9 % (ref 0.0–5.0)
HCT: 40.5 % (ref 36.0–46.0)
HEMOGLOBIN: 13.6 g/dL (ref 12.0–15.0)
LYMPHS ABS: 2.4 10*3/uL (ref 0.7–4.0)
Lymphocytes Relative: 37.7 % (ref 12.0–46.0)
MCHC: 33.4 g/dL (ref 30.0–36.0)
MCV: 87.7 fl (ref 78.0–100.0)
MONO ABS: 0.4 10*3/uL (ref 0.1–1.0)
Monocytes Relative: 6.1 % (ref 3.0–12.0)
NEUTROS ABS: 3.3 10*3/uL (ref 1.4–7.7)
Neutrophils Relative %: 52.4 % (ref 43.0–77.0)
Platelets: 260 10*3/uL (ref 150.0–400.0)
RBC: 4.62 Mil/uL (ref 3.87–5.11)
RDW: 13.2 % (ref 11.5–15.5)
WBC: 6.3 10*3/uL (ref 4.0–10.5)

## 2014-02-23 NOTE — Progress Notes (Signed)
   Subjective:    Patient ID: Michelle Kirby, female    DOB: 1951-07-01, 63 y.o.   MRN: [id number]  HPI  She is on her 6th day high-dose amoxicillin for pharyngitis. The sore throat persists along with some frontal headache & intermittent fever. She is concerned as she will be leaving town.    Review of Systems  Facial pain , dental pain,otic pain or otic discharge denied. No chills or sweats.  She denies associated nasal purulence or postnasal drainage.       Objective:   Physical Exam  There is minimal erythema the oropharynx without exudate. Complete dentures present.  General appearance:good health ;well nourished; no acute distress or increased work of breathing is present.  No  lymphadenopathy about the head, neck, or axilla noted.  Eyes: No conjunctival inflammation or lid edema is present. There is no scleral icterus. Ears:  External ear exam shows no significant lesions or deformities.  Otoscopic examination reveals clear canals, tympanic membranes are intact bilaterally without bulging, retraction, inflammation or discharge. Nose:  External nasal examination shows no deformity or inflammation. Nasal mucosa are pink and moist without lesions or exudates. No septal dislocation or deviation.No obstruction to airflow.   Oral exam: lips and gums are healthy appearing.There is no oropharyngeal  exudate noted.  Neck:  No deformities, thyromegaly, masses, or tenderness noted.   Supple with full range of motion without pain.  Heart:  Normal rate and regular rhythm. S1 and S2 normal without gallop, murmur, click, rub or other extra sounds.  Lungs:Chest clear to auscultation; no wheezes, rhonchi,rales ,or rubs present.No increased work of breathing.   Extremities:  No cyanosis, edema, or clubbing  noted  Skin: Warm & dry w/o jaundice or tenting.         Assessment & Plan:  #1 persistent sore throat, status post  5 days of amoxicillin  #2 frontal headache, rule out  sinusitis  Plan: See orders and recommendations

## 2014-02-23 NOTE — Progress Notes (Signed)
Pre visit review using our clinic review tool, if applicable. No additional management support is needed unless otherwise documented below in the visit note. 

## 2014-02-23 NOTE — Patient Instructions (Signed)
Zicam Melts or Zinc lozenges as per package label for scratchy throat . Complementary options include  vitamin C 2000 mg daily; & Echinacea for 4-7 days. Plain Mucinex (NOT D) for thick secretions ;force NON dairy fluids .   Flonase OR Nasacort AQ 1 spray in each nostril twice a day as needed. Use the "crossover" technique into opposite nostril spraying toward opposite ear @ 45 degree angle, not straight up into nostril. ongestion; going from open side to congested side . Plain Allegra (NOT D )  160 daily , Loratidine 10 mg , OR Zyrtec 10 mg @ bedtime  as needed for itchy eyes & sneezing. Report persistent or progressive fever; discolored nasal or chest secretions; or frontal headache or facial  pain.

## 2014-02-23 NOTE — Progress Notes (Signed)
   Subjective:    Patient ID: Michelle Kirby, female    DOB: Oct 26, 1950, 63 y.o.   MRN: [id number]  Sore Throat  Associated symptoms include headaches and trouble swallowing. Pertinent negatives include no congestion, coughing, diarrhea, ear discharge, ear pain, shortness of breath or vomiting.    Patient seen today in follow-up.  Was seen 8/12 by Dr Yetta BarreJones and felt to have strep, RX'd with Amoxicillin at that time.  States that symptoms are not improving and she is now additionally having frontal headaches.  Has been compliant with Amoxicillin but no other supportive care.   Review of Systems  Constitutional: Positive for fever (subjective). Negative for activity change, appetite change and unexpected weight change.  HENT: Positive for sinus pressure, sore throat and trouble swallowing. Negative for congestion, ear discharge, ear pain, postnasal drip and rhinorrhea.   Eyes: Negative for redness and visual disturbance.  Respiratory: Negative for cough, chest tightness, shortness of breath and wheezing.   Cardiovascular: Negative for chest pain.  Gastrointestinal: Negative for nausea, vomiting, diarrhea and constipation.  Musculoskeletal: Negative for myalgias.  Skin: Negative for rash.  Neurological: Positive for headaches. Negative for dizziness.       Objective:   Physical Exam  Constitutional: She is oriented to person, place, and time. She appears well-developed and well-nourished. No distress.  HENT:  Head: Normocephalic and atraumatic.  Right Ear: External ear normal.  Left Ear: External ear normal.  Nose: Nose normal.  Oropharyngeal petechia Frontal sinus tenderness  Eyes: Conjunctivae and EOM are normal. Pupils are equal, round, and reactive to light. Right eye exhibits no discharge. Left eye exhibits no discharge.  Neck: Normal range of motion. Neck supple. No thyromegaly present.  Cardiovascular: Normal rate, regular rhythm and normal heart sounds.   Pulmonary/Chest:  Effort normal and breath sounds normal. No respiratory distress. She has no wheezes.  Abdominal: Soft. Bowel sounds are normal.  Neurological: She is alert and oriented to person, place, and time.  Skin: Skin is warm and dry. She is not diaphoretic.  Psychiatric: She has a normal mood and affect. Her behavior is normal. Judgment and thought content normal.       Assessment & Plan:

## 2014-02-26 ENCOUNTER — Telehealth: Payer: Self-pay | Admitting: *Deleted

## 2014-02-26 NOTE — Telephone Encounter (Signed)
[facility] Triage Call Report Triage Record Num: [id number] Operator: Donnella Shamammy Childress Patient Name: Tomasita MorrowJoan Lannom Call Date & Time: 02/23/2014 8:59:00AM Patient Phone: [phone] PCP: Sanda Lingerhomas Jones Patient Gender: Female PCP Fax : Patient DOB: 1951-06-22 Practice Name: [facility] Reason for Call: Caller: Ortha/Patient; PCP: Sanda LingerJones, Thomas (Adults only); CB#: [phone]; Call regarding Sore Throat; onset 2wks ago; seen in the office 02/17/14 and diagnosed with strep; started on Augmentin; still c/o sore throat; c/o chills, but has not checked temp; hoarse; says this happened before and had to get a diff abx; hurts to swallow; All emergent sxs of Sore Throat or Hoarseness protocol r/o except "new onset of painful, swollen glands on sides of neck or under jaw"; disp see within 24hrs; appt scheduled for today at 1000 with Dr.Hopper Protocol(s) Used: Sore Throat or Hoarseness Recommended Outcome per Protocol: See Provider within 24 hours Reason for Outcome: New onset of painful, swollen glands on sides of neck or under jaw Care Advice: ~ 08/

## 2014-04-21 ENCOUNTER — Ambulatory Visit (INDEPENDENT_AMBULATORY_CARE_PROVIDER_SITE_OTHER): Payer: Medicare Other | Admitting: Family

## 2014-04-21 ENCOUNTER — Other Ambulatory Visit (INDEPENDENT_AMBULATORY_CARE_PROVIDER_SITE_OTHER): Payer: Medicare Other

## 2014-04-21 ENCOUNTER — Encounter: Payer: Self-pay | Admitting: Family

## 2014-04-21 VITALS — BP 164/110 | HR 81 | Temp 98.1°F | Resp 18 | Ht 63.0 in | Wt 166.1 lb

## 2014-04-21 DIAGNOSIS — R5382 Chronic fatigue, unspecified: Secondary | ICD-10-CM

## 2014-04-21 DIAGNOSIS — R5383 Other fatigue: Secondary | ICD-10-CM | POA: Insufficient documentation

## 2014-04-21 DIAGNOSIS — Z23 Encounter for immunization: Secondary | ICD-10-CM

## 2014-04-21 LAB — MONONUCLEOSIS SCREEN: Mono Screen: NEGATIVE

## 2014-04-21 NOTE — Progress Notes (Signed)
   Subjective:    Patient ID: Michelle Kirby, female    DOB: 21-Jun-1951, 63 y.o.   MRN: [id number]  HPI:  Michelle Kirby is a 63 y.o. female who presents today for an acute office visit. Indicates she has been feeling fatigue and lethargy with a scratchy throat for about 4 days now. States that there is no sore throat, has not had a fever, and she feels like she has no strength. Her current situation is stressful as she has 2 teenagers at home and a husband who is in need of a heart transplant. Is also currently being see for pain management. Has had this previously happen and she usually feels better in 2 weeks and it comes in cycles over the past 2 years. She has been tested for SLE and autoimmune diseases.  No Known Allergies  Current Outpatient Prescriptions on File Prior to Visit  Medication Sig Dispense Refill  . DULoxetine (CYMBALTA) 60 MG capsule Take 60 mg by mouth daily.        Marland Kitchen. estrogen, conjugated,-medroxyprogesterone (PREMPRO) 0.625-2.5 MG per tablet Take 1 tablet by mouth daily.        . fentaNYL (DURAGESIC - DOSED MCG/HR) 75 MCG/HR Place 75 mcg onto the skin every other day.      . methocarbamol (ROBAXIN) 500 MG tablet Take 500 mg by mouth every 8 (eight) hours.      . nebivolol (BYSTOLIC) 5 MG tablet Take 1 tablet (5 mg total) by mouth daily.  90 tablet  3  . pregabalin (LYRICA) 100 MG capsule Take 100 mg by mouth 2 (two) times daily.        . tapentadol (NUCYNTA) 50 MG TABS tablet Take by mouth 4 (four) times daily as needed.      Marland Kitchen. telmisartan (MICARDIS) 80 MG tablet TAKE 1 TABLET BY MOUTH EVERY DAY  90 tablet  3  . Armodafinil 150 MG tablet Take 1 tablet (150 mg total) by mouth daily.  30 tablet  5   No current facility-administered medications on file prior to visit.   Past Medical History  Diagnosis Date  . Depression   . Hyperlipidemia   . Hypertension   . LBP (low back pain)     Review of Systems  See HPI    Objective:     BP 164/110  Pulse 81   Temp(Src) 98.1 F (36.7 C) (Oral)  Resp 18  Ht 5\' 3"  (1.6 m)  Wt 166 lb 1.9 oz (75.352 kg)  BMI 29.43 kg/m2  SpO2 95% Nursing note and vital signs reviewed.  Physical Exam  Constitutional: She is oriented to person, place, and time. She appears well-developed and well-nourished. No distress.  Appears fatigued.  HENT:  Right Ear: Tympanic membrane, external ear and ear canal normal.  Left Ear: Tympanic membrane, external ear and ear canal normal.  Nose: Nose normal.  Mouth/Throat: Uvula is midline, oropharynx is clear and moist and mucous membranes are normal.  Cardiovascular: Normal rate, regular rhythm and normal heart sounds.   Pulmonary/Chest: Effort normal and breath sounds normal.  Neurological: She is alert and oriented to person, place, and time.  Skin: Skin is warm and dry.  Psychiatric: Her speech is normal and behavior is normal. Judgment and thought content normal. Cognition and memory are normal. She exhibits a depressed mood.        Assessment & Plan:

## 2014-04-21 NOTE — Assessment & Plan Note (Signed)
Obtain monospot to rule out EBV. Symptoms are consistent with depression and chronic fatigue syndrome. This may or may not be the result of medications. Pt will revisit with her pain management provider to discuss changes in cymbalta and nucynta.

## 2014-04-21 NOTE — Progress Notes (Signed)
Pre visit review using our clinic review tool, if applicable. No additional management support is needed unless otherwise documented below in the visit note. 

## 2014-04-21 NOTE — Patient Instructions (Addendum)
Thank you for choosing [facility].  Summary/Instructions:   Please stop by the lab before leaving for your blood work.  Talk with the pain management team regarding antidepressants.  We will contact you with the results of your mono test.Chronic Fatigue Syndrome Chronic fatigue syndrome (CFS) is a condition in which there is lasting, extreme tiredness (fatigue) that does not improve with rest. CFS affects women up to four times more often than men. If you have CFS, fatigue and other symptoms can make it hard for you to get through your day. There is no treatment or cure. You will need to work closely with your health care provider to come up with a treatment plan that works for you. CAUSES  No one knows what causes CFS. It may be triggered by a flu-like illness or by mono. Other triggers may include: An abnormal immune system. Low blood pressure. Poor diet. Physical or emotional stress. SIGNS AND SYMPTOMS The main symptom is fatigue that lasts all day, especially after physical or mental stress. Other common symptoms include: An extreme loss of energy with no obvious cause. Muscle or joint soreness. Severe weakness. Frequent headaches. Fever. Sore throat. Swollen lymph glands. Sleep is not refreshing. Loss of concentration or memory. Less common symptoms may include: Chills. Night sweats. Tingling or numbness. Blurred vision. Dizziness. Sensitivity to noise or odors. Mood swings. Anxiety, panic attacks, and depression. Your symptoms may come and go, or you may have them all the time. DIAGNOSIS  There are no tests that can help health care providers diagnose CFS. It may take a long time for you to get a correct diagnosis. Your health care provider may need to do a number of tests to rule out other conditions that could be causing your symptoms. You may be diagnosed with CFS if: You have fatigue that has lasted for at least six months. Your fatigue is not relieved by  rest. Your fatigue is not caused by another condition. Your fatigue is severe enough to interfere with work and daily activities. You have at least four common symptoms of CFS. TREATMENT  There is no cure for CFS at this time. The condition affects everyone differently. You will need to work with your health care provider to find the best treatment for your symptoms. Treatment may include: Improving sleep with a regular bedtime routine. Avoiding caffeine, alcohol, and tobacco. Doing light exercise and stretching during the day. Taking medicine to help you sleep. Taking over-the-counter medicines to relieve joint or muscle pain. Learning and practicing relaxation techniques. Using memory aids or doing brain teasers to improve memory and concentration. Seeing a mental health professional to evaluate and treat depression, if necessary. Trying massage therapy, acupuncture, and movement exercises, like yoga or tai chi. HOME CARE INSTRUCTIONS Work closely with your health care provider to follow your treatment plan at home. You may need to make major lifestyle changes. If treatment does not seem to help, get a second opinion. You may get help from many health care providers, including doctors, mental health specialists, physical therapists, and rehabilitation therapists. Having the support of friends and loved ones is also important. SEEK MEDICAL CARE IF: Your symptoms are not responding to treatment. You are having strong feelings of anger, guilt, anxiety, or depression. Document Released: 08/03/2004 Document Revised: 11/10/2013 Document Reviewed: 05/16/2013 [facility] Patient Information 2015 [place], [place]. This information is not intended to replace advice given to you by your health care provider. Make sure you discuss any questions you have with your  health care provider.

## 2014-04-22 ENCOUNTER — Telehealth: Payer: Self-pay | Admitting: Family

## 2014-04-22 NOTE — Telephone Encounter (Signed)
Tried calling pt left a message for her to call back.

## 2014-04-22 NOTE — Telephone Encounter (Signed)
Please call the patient and let her know that her mono test was negative. Therefore we still need to explore the fibromyalgia, chronic fatigue syndrome or an increase in her depression.

## 2014-04-24 NOTE — Telephone Encounter (Signed)
Tried calling pt again to give her results of labs. No answer left a message for her to call back.

## 2014-05-13 NOTE — Telephone Encounter (Signed)
Mailing pts lab results

## 2014-06-08 ENCOUNTER — Other Ambulatory Visit: Payer: Self-pay | Admitting: Internal Medicine

## 2014-12-15 ENCOUNTER — Ambulatory Visit (INDEPENDENT_AMBULATORY_CARE_PROVIDER_SITE_OTHER): Payer: Medicare Other | Admitting: Internal Medicine

## 2014-12-15 ENCOUNTER — Encounter: Payer: Self-pay | Admitting: Internal Medicine

## 2014-12-15 ENCOUNTER — Other Ambulatory Visit (INDEPENDENT_AMBULATORY_CARE_PROVIDER_SITE_OTHER): Payer: Medicare Other

## 2014-12-15 VITALS — BP 122/76 | HR 72 | Temp 98.2°F | Resp 16 | Wt 139.0 lb

## 2014-12-15 DIAGNOSIS — L299 Pruritus, unspecified: Secondary | ICD-10-CM | POA: Diagnosis not present

## 2014-12-15 DIAGNOSIS — L959 Vasculitis limited to the skin, unspecified: Secondary | ICD-10-CM

## 2014-12-15 DIAGNOSIS — K130 Diseases of lips: Secondary | ICD-10-CM | POA: Diagnosis not present

## 2014-12-15 LAB — CBC WITH DIFFERENTIAL/PLATELET
BASOS PCT: 0.9 % (ref 0.0–3.0)
Basophils Absolute: 0.1 10*3/uL (ref 0.0–0.1)
EOS PCT: 2.6 % (ref 0.0–5.0)
Eosinophils Absolute: 0.2 10*3/uL (ref 0.0–0.7)
HEMATOCRIT: 40.9 % (ref 36.0–46.0)
HEMOGLOBIN: 13.7 g/dL (ref 12.0–15.0)
LYMPHS ABS: 2.1 10*3/uL (ref 0.7–4.0)
Lymphocytes Relative: 35.7 % (ref 12.0–46.0)
MCHC: 33.5 g/dL (ref 30.0–36.0)
MCV: 88.6 fl (ref 78.0–100.0)
MONOS PCT: 6.9 % (ref 3.0–12.0)
Monocytes Absolute: 0.4 10*3/uL (ref 0.1–1.0)
Neutro Abs: 3.2 10*3/uL (ref 1.4–7.7)
Neutrophils Relative %: 53.9 % (ref 43.0–77.0)
Platelets: 242 10*3/uL (ref 150.0–400.0)
RBC: 4.62 Mil/uL (ref 3.87–5.11)
RDW: 13.6 % (ref 11.5–15.5)
WBC: 5.9 10*3/uL (ref 4.0–10.5)

## 2014-12-15 LAB — HEPATIC FUNCTION PANEL
ALK PHOS: 47 U/L (ref 39–117)
ALT: 14 U/L (ref 0–35)
AST: 16 U/L (ref 0–37)
Albumin: 4.1 g/dL (ref 3.5–5.2)
Bilirubin, Direct: 0.1 mg/dL (ref 0.0–0.3)
TOTAL PROTEIN: 6.7 g/dL (ref 6.0–8.3)
Total Bilirubin: 0.7 mg/dL (ref 0.2–1.2)

## 2014-12-15 MED ORDER — HYDROXYZINE HCL 10 MG PO TABS: 10.0000 mg | ORAL_TABLET | Freq: Three times a day (TID) | ORAL | Status: DC | PRN

## 2014-12-15 NOTE — Progress Notes (Signed)
Pre visit review using our clinic review tool, if applicable. No additional management support is needed unless otherwise documented below in the visit note. 

## 2014-12-15 NOTE — Progress Notes (Signed)
   Subjective:    Patient ID: Michelle Kirby, female    DOB: Apr 16, 1951, 64 y.o.   MRN: [id number]  HPI  Her symptoms began 12/08/14 as irritation at the corners of her mouth. That has progressed and is associated with some intraoral lesions. She's had multiple facial papules erupt as well. She's been applying vanilla to the "cold sores" over the upper lip and the corners of mouth. These lesions are intensely pruritic.  Alcohol use denied.      Review of Systems  No associated itchy, watery eyes.  Swelling of the lips or tongue denied.  Shortness of breath, wheezing, or cough absent.  No vesicles, pustules or urticaria noted.  Fever ,chills , or sweats denied.   Diarrhea not present.  No dysuria, pyuria or hematuria.    Objective:   Physical Exam Pertinent or positive findings include:  Cheilosis is present at the core the mouth bilaterally. She has more than a dozen small ,flat,faintly erythematous lesions varying in size from 1 x 1 mm to 3 x 3 mm over her cheeks & nose. These lesions blanch with pressure. She has a grade 1/2-1 systolic murmur.   General appearance :adequately nourished; in no distress.  Eyes: No conjunctival inflammation or scleral icterus is present.  Oral exam: There is no oropharyngeal erythema or exudate noted. Dental hygiene is good.  Heart:  Normal rate and regular rhythm. S1 and S2 normal without gallop, click, rub or other extra sounds    Lungs:Chest clear to auscultation; no wheezes, rhonchi,rales ,or rubs present.No increased work of breathing.   Abdomen: bowel sounds normal, soft and non-tender without masses, organomegaly or hernias noted.  No guarding or rebound.   Vascular : all pulses equal ; no bruits present.  Skin:Warm & dry; no tenting or jaundice   Lymphatic: No lymphadenopathy is noted about the head, neck, axilla  Neuro: Strength, tone & DTRs normal.        Assessment & Plan:  #1 facial vasculitis  #2  cheilosis  See orders and after visit summary

## 2014-12-15 NOTE — Patient Instructions (Addendum)
Avoid soaps and cosmetics which are not hypoallergenic. Restrict hyperallergenic foods at this time: Nuts, strawberries, seafood , chocolate, and tomatoes. If you have any skin eruption; please document any dietary , medicinal, chemical or environmental exposures in the previous 8-12 hours.  Please use A&D ointment to the lip lesions twice a day. If this does not heal completely over the next 2 weeks; Dermatology referral is recommended.

## 2015-01-07 ENCOUNTER — Ambulatory Visit (INDEPENDENT_AMBULATORY_CARE_PROVIDER_SITE_OTHER): Payer: Medicare Other | Admitting: Internal Medicine

## 2015-01-07 ENCOUNTER — Encounter: Payer: Self-pay | Admitting: Internal Medicine

## 2015-01-07 ENCOUNTER — Other Ambulatory Visit (INDEPENDENT_AMBULATORY_CARE_PROVIDER_SITE_OTHER): Payer: Medicare Other

## 2015-01-07 VITALS — BP 140/80 | HR 84 | Temp 98.4°F | Resp 16 | Wt 135.0 lb

## 2015-01-07 DIAGNOSIS — R682 Dry mouth, unspecified: Secondary | ICD-10-CM

## 2015-01-07 DIAGNOSIS — H04123 Dry eye syndrome of bilateral lacrimal glands: Secondary | ICD-10-CM

## 2015-01-07 DIAGNOSIS — I776 Arteritis, unspecified: Secondary | ICD-10-CM | POA: Diagnosis not present

## 2015-01-07 DIAGNOSIS — I10 Essential (primary) hypertension: Secondary | ICD-10-CM

## 2015-01-07 DIAGNOSIS — K117 Disturbances of salivary secretion: Secondary | ICD-10-CM | POA: Diagnosis not present

## 2015-01-07 DIAGNOSIS — K130 Diseases of lips: Secondary | ICD-10-CM

## 2015-01-07 LAB — SEDIMENTATION RATE: Sed Rate: 7 mm/hr (ref 0–22)

## 2015-01-07 MED ORDER — AMLODIPINE BESYLATE 5 MG PO TABS: 5.0000 mg | ORAL_TABLET | Freq: Every day | ORAL | Status: DC

## 2015-01-07 NOTE — Progress Notes (Signed)
   Subjective:    Patient ID: Michelle Kirby, female    DOB: 1950-08-31, 64 y.o.   MRN: [id number]  HPI  She has  burning pain and erythema and associated cracking of the lips. She took an anti-viral medication of her daughter's which did help, but the symptoms returned in about 3 days.  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 She attributes this to her medications.  She has had small vascular lesions over face for approximately 6 weeks. She's also had an irregular erythematous rash at the site of the Fentanyl patch that appears on the third day and is not worrisome to her  Review of Systems She denies fever, chills, sweats, weight loss. She denies any swelling or redness of her joints. No associated itchy, watery eyes. Swelling of the tongue denied. Shortness of breath, wheezing, or cough absent. No  urticaria noted.  Purulence absent. Diarrhea not present.       Objective:   Physical Exam Pertinent or positive findings include: She has very small, faint vasculitic lesions over the face which are difficult to discern. These do blanch with pressure.She has complete dentures. The lips are erythematous and dry with appearance of some low-grade eschar formation. No vesicles or pustules present.There is splitting the first heart sound versus an S4.  General appearance :adequately nourished; in no distress.  Eyes: No conjunctival inflammation or scleral icterus is present.  Oral exam:  Lips and gums are healthy appearing.There is no oropharyngeal erythema or exudate noted.   Heart:  Normal rate and regular rhythm. S1 and S2 normal without gallop, murmur, click, rub or other extra sounds    Lungs:Chest clear to auscultation; no wheezes, rhonchi,rales ,or rubs present.No increased work of breathing.   Abdomen: bowel sounds normal, soft and non-tender without masses, organomegaly or hernias noted.  No guarding or rebound.   Vascular : all  pulses equal ; no bruits present.  Skin:Warm & dry ; no tenting or jaundice   Lymphatic: No lymphadenopathy is noted about the head, neck, axilla   Neuro: Strength, tone  normal.        Assessment & Plan:  #1 lip dermatitis with burning and some edema. Rule out reaction to the angiotensin receptor blocker she is taking.  #2 facial vasculitis. Rule out lupus or variant.  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  Plan: See orders and recommendations. The ARB will be changed to a non rate limiting calcium channel blocker.

## 2015-01-07 NOTE — Progress Notes (Signed)
Pre visit review using our clinic review tool, if applicable. No additional management support is needed unless otherwise documented below in the visit note. 

## 2015-01-07 NOTE — Patient Instructions (Signed)
  Your next office appointment will be determined based upon review of your pending labs.  Those written interpretation of the lab results and instructions will be transmitted to you by My Chart  Critical results will be called.   Followup as needed for any active or acute issue. Please report any significant change in your symptoms.  Dip gauze in  sterile saline and applied to the lips twice a day.  The saline can be purchased at the drugstore or you can make your own .Boil cup of salt in a gallon of water. Store mixture  in a clean container.Report Warning  signs as discussed (red streaks, pus, fever, increasing pain).   Monitor BP on new BP medication. Minimal Blood Pressure Goal= AVERAGE < 140/90;  Ideal is an AVERAGE < 135/85. This AVERAGE should be calculated from @ least 5-7 BP readings taken @ different times of day on different days of week. You should not respond to isolated BP readings , but rather the AVERAGE for that week .Please bring your  blood pressure cuff to office visits to verify that it is reliable.It  can also be checked against the blood pressure device at the pharmacy. Finger or wrist cuffs are not dependable; an arm cuff is.

## 2015-01-08 LAB — ANA: ANA: NEGATIVE

## 2015-04-09 ENCOUNTER — Other Ambulatory Visit: Payer: Self-pay | Admitting: Internal Medicine

## 2015-04-09 NOTE — Telephone Encounter (Signed)
Please advise 

## 2015-04-15 ENCOUNTER — Other Ambulatory Visit: Payer: Self-pay

## 2015-04-15 DIAGNOSIS — I10 Essential (primary) hypertension: Secondary | ICD-10-CM

## 2015-04-15 MED ORDER — AMLODIPINE BESYLATE 5 MG PO TABS: 5.0000 mg | ORAL_TABLET | Freq: Every day | ORAL | Status: DC

## 2015-05-05 ENCOUNTER — Encounter: Payer: Self-pay | Admitting: Internal Medicine

## 2015-05-05 ENCOUNTER — Other Ambulatory Visit (INDEPENDENT_AMBULATORY_CARE_PROVIDER_SITE_OTHER): Payer: Medicare Other

## 2015-05-05 ENCOUNTER — Ambulatory Visit (INDEPENDENT_AMBULATORY_CARE_PROVIDER_SITE_OTHER): Payer: Medicare Other | Admitting: Internal Medicine

## 2015-05-05 VITALS — BP 138/82 | HR 72 | Temp 98.4°F | Resp 16 | Ht 63.5 in | Wt 124.4 lb

## 2015-05-05 DIAGNOSIS — I10 Essential (primary) hypertension: Secondary | ICD-10-CM

## 2015-05-05 DIAGNOSIS — R454 Irritability and anger: Secondary | ICD-10-CM | POA: Diagnosis not present

## 2015-05-05 DIAGNOSIS — R000 Tachycardia, unspecified: Secondary | ICD-10-CM | POA: Diagnosis not present

## 2015-05-05 DIAGNOSIS — N926 Irregular menstruation, unspecified: Secondary | ICD-10-CM

## 2015-05-05 LAB — CBC WITH DIFFERENTIAL/PLATELET
BASOS ABS: 0 10*3/uL (ref 0.0–0.1)
Basophils Relative: 0.7 % (ref 0.0–3.0)
EOS PCT: 1.3 % (ref 0.0–5.0)
Eosinophils Absolute: 0.1 10*3/uL (ref 0.0–0.7)
HEMATOCRIT: 41.9 % (ref 36.0–46.0)
HEMOGLOBIN: 14 g/dL (ref 12.0–15.0)
LYMPHS ABS: 2.2 10*3/uL (ref 0.7–4.0)
LYMPHS PCT: 33.8 % (ref 12.0–46.0)
MCHC: 33.4 g/dL (ref 30.0–36.0)
MCV: 86.7 fl (ref 78.0–100.0)
MONOS PCT: 7 % (ref 3.0–12.0)
Monocytes Absolute: 0.5 10*3/uL (ref 0.1–1.0)
NEUTROS PCT: 57.2 % (ref 43.0–77.0)
Neutro Abs: 3.8 10*3/uL (ref 1.4–7.7)
Platelets: 310 10*3/uL (ref 150.0–400.0)
RBC: 4.83 Mil/uL (ref 3.87–5.11)
RDW: 12.8 % (ref 11.5–15.5)
WBC: 6.6 10*3/uL (ref 4.0–10.5)

## 2015-05-05 LAB — HEPATIC FUNCTION PANEL
ALK PHOS: 52 U/L (ref 39–117)
ALT: 12 U/L (ref 0–35)
AST: 10 U/L (ref 0–37)
Albumin: 4 g/dL (ref 3.5–5.2)
Bilirubin, Direct: 0.1 mg/dL (ref 0.0–0.3)
Total Bilirubin: 0.6 mg/dL (ref 0.2–1.2)
Total Protein: 6.7 g/dL (ref 6.0–8.3)

## 2015-05-05 LAB — BASIC METABOLIC PANEL
BUN: 11 mg/dL (ref 6–23)
CALCIUM: 9.3 mg/dL (ref 8.4–10.5)
CO2: 27 meq/L (ref 19–32)
Chloride: 104 mEq/L (ref 96–112)
Creatinine, Ser: 0.8 mg/dL (ref 0.40–1.20)
GFR: 76.98 mL/min (ref >60.00)
Glucose, Bld: 95 mg/dL (ref 70–99)
Potassium: 3.8 mEq/L (ref 3.5–5.1)
SODIUM: 140 meq/L (ref 135–145)

## 2015-05-05 LAB — TSH: TSH: 1.21 u[IU]/mL (ref 0.35–4.50)

## 2015-05-05 NOTE — Progress Notes (Signed)
   Subjective:    Patient ID: Michelle Kirby, female    DOB: 09-14-1950, 64 y.o.   MRN: [id number]  HPI She is not monitoring her blood pressure at home. Micardis was discontinued 01/07/15 because of facial dermatitis. She was changed to amlodipine 5 mg daily.  She states that she is on a very strict diet of no refined sugars with decreased red meat, fried foods, no added salt. With this she's lost 40 pounds  Recently she lost 10 pounds when she was taken off fentanyl. She believes the weight loss was related to withdrawal. She described increased irritability. She states her children told her it was almost as if she were "on cocaine".  She's now on an extended release pain medicine, Opana ER with Percocet for breakthrough pain. Also there has been an increase in Lyrica dose.  In this context she had breakthrough bleeding 1 week ago. Her last menstrual period had been @ age 64, 13 years ago. She is on Prempro. Her last Pap smear was a month ago &  was negative.  She has no other bleeding dyscrasias other than easy bruising. She does have night sweats.  She's not had a colonoscopy to date. She completes Hemoccult cards from her Gynecologist. She never pursued it "because it's not in my family history". Standard of care was reviewed   Review of Systems Epistaxis, hemoptysis, hematuria, melena, or rectal bleeding denied. No significant dyspepsia,dysphagia, or abdominal pain.  There is no  difficulty stopping bleeding with injury.  Chest pain, palpitations, tachycardia, exertional dyspnea, paroxysmal nocturnal dyspnea, claudication or edema are absent.     Objective:   Physical Exam  Pertinent or positive findings include: She has bruising over the dorsum of left hand. Repeat pulse was 72. She has complete dentures. The first and second heart sounds are increased. She has mild crepitus of the knees.  General appearance :adequately nourished; in no distress.  Eyes: No conjunctival  inflammation or scleral icterus is present.  Oral exam:  Lips and gums are healthy appearing.There is no oropharyngeal erythema or exudate noted.   Heart:  Normal rate and regular rhythm without gallop, murmur, click, rub or other extra sounds    Lungs:Chest clear to auscultation; no wheezes, rhonchi,rales ,or rubs present.No increased work of breathing.   Abdomen: bowel sounds normal, soft and non-tender without masses, organomegaly or hernias noted.  No guarding or rebound.   Vascular : all pulses equal ; no bruits present.  Skin:Warm & dry.  Intact without suspicious lesions or rashes ; no tenting or jaundice   Lymphatic: No lymphadenopathy is noted about the head, neck, axilla.   Neuro: Strength, tone & DTRs normal.      Assessment & Plan:  #1 hypertension  #2 breakthrough bleeding; this is most likely related to changes in her pain medication regimen  #3 irritability  #4 tachycardia, transient  See orders

## 2015-05-05 NOTE — Progress Notes (Signed)
Pre visit review using our clinic review tool, if applicable. No additional management support is needed unless otherwise documented below in the visit note. 

## 2015-05-05 NOTE — Patient Instructions (Addendum)
  Your next office appointment will be determined based upon review of your pending labs .  Those written interpretation of the lab results and instructions will be transmitted to you by My Chart  Critical results will be called.   Followup as needed for any active or acute issue.    Please report any significant change in your symptoms.Minimal Blood Pressure Goal= AVERAGE < 140/90;  Ideal is an AVERAGE < 135/85. This AVERAGE should be calculated from @ least 5-7 BP readings taken @ different times of day on different days of week. You should not respond to isolated BP readings , but rather the AVERAGE for that week .Please bring your  blood pressure cuff to office visits to verify that it is reliable.It  can also be checked against the blood pressure device at the pharmacy. Finger or wrist cuffs are not dependable; an arm cuff is.   ColoGuard may be an option in place of colonoscopy; you should explore the cost of this testing with your insurance company. If that test is positive; colonoscopy is mandatory. #1 Ages 8650-85 #2 No signs or symptoms of colorectal disease including but not limited to lower gastrointestinal pain; blood in the stool; positive stool cards; or abnormal immunochemical stool studies. #3 Patient must be of average risk of developing colorectal cancer and have no personal history of adenomatous polyps, colorectal cancer or inflammatory bowel disease such as Crohn's disease and ulcerative colitis. There also may be no family history of colorectal cancers or adenomatous polyps, familial adenomatous polyposis or hereditary non-polyposis colorectal cancer.

## 2015-05-14 ENCOUNTER — Telehealth: Payer: Self-pay | Admitting: Internal Medicine

## 2015-05-14 NOTE — Telephone Encounter (Signed)
Per dr hopper, patient advised that her gyn needs to make the determination as to what test needs to run for abnormal break through bleeding after ceased menstruation---patient is to talk with obgyn doctor

## 2015-05-14 NOTE — Telephone Encounter (Signed)
Patient states she had Dr. Alwyn RenHopper refer her to a gynecologist and that the gynecologist is going to run test this Monday.  Patient is requesting a note to be sent over with what test he would like to have ran. Patient was referred to Dr[facility] Jackelyn [facility]Meisinger at [facility].  Ph [phone].

## 2016-01-05 ENCOUNTER — Other Ambulatory Visit: Payer: Self-pay | Admitting: Internal Medicine

## 2016-03-01 ENCOUNTER — Ambulatory Visit (INDEPENDENT_AMBULATORY_CARE_PROVIDER_SITE_OTHER): Payer: Medicare Other

## 2016-03-01 DIAGNOSIS — Z23 Encounter for immunization: Secondary | ICD-10-CM | POA: Diagnosis not present

## 2016-03-08 ENCOUNTER — Ambulatory Visit (INDEPENDENT_AMBULATORY_CARE_PROVIDER_SITE_OTHER): Payer: Medicare Other | Admitting: Nurse Practitioner

## 2016-03-08 ENCOUNTER — Encounter: Payer: Self-pay | Admitting: Nurse Practitioner

## 2016-03-08 VITALS — BP 122/68 | HR 64 | Temp 98.2°F | Wt 138.0 lb

## 2016-03-08 DIAGNOSIS — L237 Allergic contact dermatitis due to plants, except food: Secondary | ICD-10-CM | POA: Diagnosis not present

## 2016-03-08 MED ORDER — METHYLPREDNISOLONE ACETATE 80 MG/ML IJ SUSP
80.0000 mg | Freq: Once | INTRAMUSCULAR | Status: AC
  Administered 2016-03-08: 80 mg via INTRAMUSCULAR

## 2016-03-08 MED ORDER — TRIAMCINOLONE ACETONIDE 0.1 % EX CREA: 1.0000 "application " | TOPICAL_CREAM | Freq: Two times a day (BID) | CUTANEOUS | 0 refills | Status: AC

## 2016-03-08 NOTE — Progress Notes (Signed)
Subjective:  Patient ID: Michelle Kirby, female    DOB: [birth year]  Age: 65 y.o. MRN: [id number]  CC: Rash (after doing yard work 2days ago)   Rash  This is a new problem. The current episode started in the past 7 days. The problem has been gradually worsening since onset. The affected locations include the right arm. The rash is characterized by redness and itchiness. She was exposed to plant contact. Pertinent negatives include no anorexia, congestion, eye pain, facial edema, fatigue, fever or shortness of breath. Past treatments include anti-itch cream. The treatment provided no relief.    Outpatient Medications Prior to Visit  Medication Sig Dispense Refill  . amLODipine (NORVASC) 5 MG tablet Take 1 tablet (5 mg total) by mouth daily. Yearly physical due in Oct must see MD for refills 90 tablet 0  . estrogen, conjugated,-medroxyprogesterone (PREMPRO) 0.625-2.5 MG per tablet Take 1 tablet by mouth daily.      Marland Kitchen oxyCODONE-acetaminophen (PERCOCET) 10-325 MG tablet TAKE 1 TABLET 5 TIMES A DAY  0  . traZODone (DESYREL) 100 MG tablet TAKE UP TO 3 TABLET BY MOUTH AT BEDTIME  6  . Armodafinil 150 MG tablet Take 1 tablet (150 mg total) by mouth daily. (Patient not taking: Reported on 05/05/2015) 30 tablet 5  . DULoxetine (CYMBALTA) 30 MG capsule TAKE ONE CAPSULE BY MOUTH EVERY DAY WITH 60 MG  5  . DULoxetine (CYMBALTA) 60 MG capsule Take 60 mg by mouth daily.      . fentaNYL (DURAGESIC - DOSED MCG/HR) 75 MCG/HR Place 100 mcg onto the skin every other day.     . hydrOXYzine (ATARAX/VISTARIL) 10 MG tablet Take 1 tablet (10 mg total) by mouth 3 (three) times daily as needed. (Patient not taking: Reported on 05/05/2015) 21 tablet 0  . LYRICA 200 MG capsule 200 mg. Take one at supper and two at bedtime.    . methocarbamol (ROBAXIN) 500 MG tablet Take 500 mg by mouth every 8 (eight) hours.    . nebivolol (BYSTOLIC) 5 MG tablet Take 1 tablet (5 mg total) by mouth daily. (Patient not taking: Reported  on 05/05/2015) 90 tablet 3  . OPANA ER, CRUSH RESISTANT, 15 MG T12A Take 1 tablet by mouth every 12 (twelve) hours.  0  . pregabalin (LYRICA) 100 MG capsule Take 100 mg by mouth 2 (two) times daily.      . tapentadol (NUCYNTA) 50 MG TABS tablet Take by mouth 4 (four) times daily as needed.     No facility-administered medications prior to visit.     ROS See HPI  Objective:  BP 122/68   Pulse 64   Temp 98.2 F (36.8 C)   Wt 138 lb (62.6 kg)   SpO2 98%   BMI 24.06 kg/m   BP Readings from Last 3 Encounters:  03/08/16 122/68  05/05/15 138/82  01/07/15 140/80    Wt Readings from Last 3 Encounters:  03/08/16 138 lb (62.6 kg)  05/05/15 124 lb 6.4 oz (56.4 kg)  01/07/15 135 lb (61.2 kg)    Physical Exam  Constitutional: She is oriented to person, place, and time.  Neurological: She is alert and oriented to person, place, and time.  Skin: Rash noted. Rash is macular. There is erythema.     Skin intact    Lab Results  Component Value Date   WBC 6.6 05/05/2015   HGB 14.0 05/05/2015   HCT 41.9 05/05/2015   PLT 310.0 05/05/2015   GLUCOSE 95 05/05/2015  CHOL 251 (H) 05/23/2013   TRIG 263.0 (H) 05/23/2013   HDL 63.90 05/23/2013   LDLDIRECT 159.8 05/23/2013   ALT 12 05/05/2015   AST 10 05/05/2015   NA 140 05/05/2015   K 3.8 05/05/2015   CL 104 05/05/2015   CREATININE 0.80 05/05/2015   BUN 11 05/05/2015   CO2 27 05/05/2015   TSH 1.21 05/05/2015   HGBA1C 5.4 02/23/2011    Assessment & Plan:   Michelle Kirby was seen today for rash.  Diagnoses and all orders for this visit:  Allergic dermatitis due to poison ivy -     methylPREDNISolone acetate (DEPO-MEDROL) injection 80 mg; Inject 1 mL (80 mg total) into the muscle once. -     triamcinolone cream (KENALOG) 0.1 %; Apply 1 application topically 2 (two) times daily.   I am having Michelle Kirby start on triamcinolone cream. I am also having her maintain her Armodafinil, DULoxetine, pregabalin, estrogen  (conjugated)-medroxyprogesterone, methocarbamol, tapentadol, fentaNYL, nebivolol, hydrOXYzine, DULoxetine, oxyCODONE-acetaminophen, OPANA ER (CRUSH RESISTANT), LYRICA, traZODone, amLODipine, pregabalin, and OxyCODONE ER. We will continue to administer methylPREDNISolone acetate.  Meds ordered this encounter  Medications  . pregabalin (LYRICA) 300 MG capsule    Sig: Take 300 mg by mouth 2 (two) times daily.  . OxyCODONE ER (XTAMPZA ER) 18 MG C12A    Sig: Take by mouth 2 (two) times daily.  . methylPREDNISolone acetate (DEPO-MEDROL) injection 80 mg  . triamcinolone cream (KENALOG) 0.1 %    Sig: Apply 1 application topically 2 (two) times daily.    Dispense:  30 g    Refill:  0    Order Specific Question:   Supervising Provider    Answer:   Tresa GarterPLOTNIKOV, ALEKSEI V [[id number]]    Follow-up: Return if symptoms worsen or fail to improve.  Michelle Pennaharlotte Jashon Ishida, NP

## 2016-03-08 NOTE — Progress Notes (Signed)
Pre visit review using our clinic review tool, if applicable. No additional management support is needed unless otherwise documented below in the visit note. 

## 2016-03-08 NOTE — Patient Instructions (Signed)
Poison Ivy Dermatitis- Will rx with solumedrol here in the office today. Use cream as prescribed. Avoid excessive sunlight exposure Avoid hot showers.  Pt was advised to call if increased erythema/swelling, if symptoms worsen or if symptoms are not improved in 2week.  Add daily antihistamine such as zyrtec 10mg  once daily. OK to take 1 tablet of benadryl at bedtime as needed for itching.

## 2016-03-31 ENCOUNTER — Other Ambulatory Visit: Payer: Self-pay | Admitting: Internal Medicine

## 2016-06-29 ENCOUNTER — Other Ambulatory Visit: Payer: Self-pay | Admitting: Internal Medicine

## 2018-04-05 ENCOUNTER — Encounter: Payer: Self-pay | Admitting: Family

## 2018-04-05 ENCOUNTER — Ambulatory Visit (INDEPENDENT_AMBULATORY_CARE_PROVIDER_SITE_OTHER): Payer: Medicare Other | Admitting: Family

## 2018-04-05 VITALS — BP 124/76 | HR 73 | Temp 98.2°F | Ht 63.5 in | Wt 134.1 lb

## 2018-04-05 DIAGNOSIS — I10 Essential (primary) hypertension: Secondary | ICD-10-CM

## 2018-04-05 DIAGNOSIS — Z23 Encounter for immunization: Secondary | ICD-10-CM | POA: Diagnosis not present

## 2018-04-05 DIAGNOSIS — G8929 Other chronic pain: Secondary | ICD-10-CM

## 2018-04-05 MED ORDER — AMLODIPINE BESYLATE 5 MG PO TABS: 5.0000 mg | ORAL_TABLET | Freq: Every day | ORAL | 0 refills | Status: DC

## 2018-04-05 NOTE — Progress Notes (Signed)
Michelle Kirby is a 67 y.o. female with the following history as recorded in Epic[employer]:  Patient Active Problem List   Diagnosis Date Noted  . Fatigue 04/21/2014  . Other screening mammogram 05/23/2013  . Routine general medical examination at a health care facility 05/23/2013  . Other chronic pain 02/25/2010  . HYPERLIPIDEMIA 07/10/2007  . ANXIETY DEPRESSION 07/10/2007  . NARCOTIC ABUSE 07/10/2007  . Essential hypertension, benign 07/10/2007    Current Outpatient Medications  Medication Sig Dispense Refill  . amLODipine (NORVASC) 5 MG tablet Take 1 tablet (5 mg total) by mouth daily. 90 tablet 0  . Morphine Sulfate ER 15 MG TBEA Take by mouth.    . norethindrone-ethinyl estradiol (FEMHRT 1/5) 1-5 MG-MCG TABS tablet Take by mouth daily.    Marland Kitchen oxyCODONE-acetaminophen (PERCOCET) 10-325 MG tablet Take 1 tablet by mouth 2 (two) times daily as needed.    . pregabalin (LYRICA) 300 MG capsule Take 300 mg by mouth 2 (two) times daily.    . traZODone (DESYREL) 100 MG tablet TAKE UP TO 3 TABLET BY MOUTH AT BEDTIME  6  . triamcinolone cream (KENALOG) 0.1 % Apply 1 application topically 2 (two) times daily. 30 g 0   No current facility-administered medications for this visit.     Allergies: Micardis [telmisartan]  Past Medical History:  Diagnosis Date  . Depression   . Hyperlipidemia   . Hypertension   . LBP (low back pain)     Past Surgical History:  Procedure Laterality Date  . BREAST BIOPSY    . LUMBAR FUSION  1997    Family History  Problem Relation Age of Onset  . Cancer Mother   . Hypertension Father   . Heart disease Father   . Hypertension Sister   . Hypertension Brother   . Cancer Brother   . Alcohol abuse Neg Hx   . COPD Neg Hx   . Drug abuse Neg Hx   . Early death Neg Hx   . Hyperlipidemia Neg Hx   . Kidney disease Neg Hx   . Stroke Neg Hx   . Hypertension Brother   . Hypertension Sister     Social History   Tobacco Use  . Smoking status: Former Games developer  .  Smokeless tobacco: Never Used  Substance Use Topics  . Alcohol use: No    Subjective:  Patient has been a patient of Dr. Yetta Barre for 10+ years; moved to [facility] in the past year with plans for retirement- unfortunately, her husband passed away with 6 months of moving to [facility]; has moved back to be with family; plans to re-establish with Dr. Yetta Barre but needs update Rx for blood pressure medication until she can see him; Has chronic pain provider, GYN here in [place];  Would like to get her flu shot today; plans to come back for CPE with Dr. Yetta Barre in the next few months.   Objective:  Vitals:   04/05/18 1020  BP: 124/76  Pulse: 73  Temp: 98.2 F (36.8 C)  TempSrc: Oral  SpO2: 95%  Weight: 134 lb 1.9 oz (60.8 kg)  Height: 5' 3.5" (1.613 m)    General: Well developed, well nourished, in no acute distress  Skin : Warm and dry.  Head: Normocephalic and atraumatic  Lungs: Respirations unlabored; clear to auscultation bilaterally without wheeze, rales, rhonchi  CVS exam: normal rate and regular rhythm.  Neurologic: Alert and oriented; speech intact; face symmetrical; moves all extremities well; CNII-XII intact without focal deficit  Assessment:  1. Essential hypertension, benign   2. Other chronic pain   3. Need for influenza vaccination     Plan:  1. Stable; refill on Amlodipine as requested; plan for CPE in 2 months; will be going to see her GYN; 2. See pain management provider as scheduled; refills will not be given from this office. 3. Flu vaccine given.   Return in about 2 months (around 06/05/2018) for with Dr. Yetta Barre CPE.  Orders Placed This Encounter  Procedures  . Flu vaccine HIGH DOSE PF (Fluzone High dose)    Requested Prescriptions   Signed Prescriptions Disp Refills  . amLODipine (NORVASC) 5 MG tablet 90 tablet 0    Sig: Take 1 tablet (5 mg total) by mouth daily.

## 2018-04-16 LAB — HM MAMMOGRAPHY: HM MAMMO: NORMAL (ref 0–4)

## 2018-05-15 LAB — HM PAP SMEAR

## 2018-06-04 ENCOUNTER — Ambulatory Visit: Payer: Medicare Other | Admitting: Internal Medicine

## 2018-06-10 ENCOUNTER — Other Ambulatory Visit: Payer: Self-pay | Admitting: Pain Medicine

## 2018-06-10 DIAGNOSIS — R200 Anesthesia of skin: Secondary | ICD-10-CM

## 2018-06-10 DIAGNOSIS — R202 Paresthesia of skin: Principal | ICD-10-CM

## 2018-06-12 ENCOUNTER — Ambulatory Visit (INDEPENDENT_AMBULATORY_CARE_PROVIDER_SITE_OTHER): Payer: Medicare Other | Admitting: Internal Medicine

## 2018-06-12 ENCOUNTER — Other Ambulatory Visit (INDEPENDENT_AMBULATORY_CARE_PROVIDER_SITE_OTHER): Payer: Medicare Other

## 2018-06-12 ENCOUNTER — Encounter: Payer: Self-pay | Admitting: Internal Medicine

## 2018-06-12 VITALS — BP 128/72 | HR 62 | Temp 97.9°F | Ht 63.5 in | Wt 131.5 lb

## 2018-06-12 DIAGNOSIS — D692 Other nonthrombocytopenic purpura: Secondary | ICD-10-CM

## 2018-06-12 DIAGNOSIS — Z1159 Encounter for screening for other viral diseases: Secondary | ICD-10-CM

## 2018-06-12 DIAGNOSIS — F5104 Psychophysiologic insomnia: Secondary | ICD-10-CM | POA: Diagnosis not present

## 2018-06-12 DIAGNOSIS — E785 Hyperlipidemia, unspecified: Secondary | ICD-10-CM

## 2018-06-12 DIAGNOSIS — I10 Essential (primary) hypertension: Secondary | ICD-10-CM

## 2018-06-12 DIAGNOSIS — Z0000 Encounter for general adult medical examination without abnormal findings: Secondary | ICD-10-CM | POA: Diagnosis not present

## 2018-06-12 DIAGNOSIS — Z23 Encounter for immunization: Secondary | ICD-10-CM | POA: Diagnosis not present

## 2018-06-12 DIAGNOSIS — Z1231 Encounter for screening mammogram for malignant neoplasm of breast: Secondary | ICD-10-CM

## 2018-06-12 LAB — CBC WITH DIFFERENTIAL/PLATELET
Basophils Absolute: 0.1 10*3/uL (ref 0.0–0.1)
Basophils Relative: 1.7 % (ref 0.0–3.0)
Eosinophils Absolute: 0.2 10*3/uL (ref 0.0–0.7)
Eosinophils Relative: 2.1 % (ref 0.0–5.0)
HCT: 40 % (ref 36.0–46.0)
HEMOGLOBIN: 13.5 g/dL (ref 12.0–15.0)
Lymphocytes Relative: 36 % (ref 12.0–46.0)
Lymphs Abs: 2.7 10*3/uL (ref 0.7–4.0)
MCHC: 33.8 g/dL (ref 30.0–36.0)
MCV: 88.5 fl (ref 78.0–100.0)
MONO ABS: 0.6 10*3/uL (ref 0.1–1.0)
Monocytes Relative: 7.6 % (ref 3.0–12.0)
Neutro Abs: 3.9 10*3/uL (ref 1.4–7.7)
Neutrophils Relative %: 52.6 % (ref 43.0–77.0)
Platelets: 290 10*3/uL (ref 150.0–400.0)
RBC: 4.52 Mil/uL (ref 3.87–5.11)
RDW: 12.6 % (ref 11.5–15.5)
WBC: 7.4 10*3/uL (ref 4.0–10.5)

## 2018-06-12 LAB — COMPREHENSIVE METABOLIC PANEL
ALT: 17 U/L (ref 0–35)
AST: 16 U/L (ref 0–37)
Albumin: 4.2 g/dL (ref 3.5–5.2)
Alkaline Phosphatase: 58 U/L (ref 39–117)
BUN: 16 mg/dL (ref 6–23)
CO2: 28 mEq/L (ref 19–32)
Calcium: 9.5 mg/dL (ref 8.4–10.5)
Chloride: 107 mEq/L (ref 96–112)
Creatinine, Ser: 1.02 mg/dL (ref 0.40–1.20)
GFR: 57.59 mL/min — ABNORMAL LOW (ref >60.00)
Glucose, Bld: 97 mg/dL (ref 70–99)
Potassium: 3.9 mEq/L (ref 3.5–5.1)
SODIUM: 140 meq/L (ref 135–145)
TOTAL PROTEIN: 6.7 g/dL (ref 6.0–8.3)
Total Bilirubin: 0.8 mg/dL (ref 0.2–1.2)

## 2018-06-12 LAB — LIPID PANEL
Cholesterol: 178 mg/dL (ref 0–200)
HDL: 58 mg/dL (ref >39.00)
LDL Cholesterol: 82 mg/dL (ref 0–99)
NonHDL: 119.76
Total CHOL/HDL Ratio: 3
Triglycerides: 190 mg/dL — ABNORMAL HIGH (ref 0.0–149.0)
VLDL: 38 mg/dL (ref 0.0–40.0)

## 2018-06-12 LAB — PROTIME-INR
INR: 1 ratio (ref 0.8–1.0)
Prothrombin Time: 11.4 s (ref 9.6–13.1)

## 2018-06-12 LAB — APTT: aPTT: 31.8 s (ref 23.4–32.7)

## 2018-06-12 LAB — TSH: TSH: 0.87 u[IU]/mL (ref 0.35–4.50)

## 2018-06-12 MED ORDER — TRAZODONE HCL 50 MG PO TABS: 50.0000 mg | ORAL_TABLET | Freq: Every day | ORAL | 1 refills | Status: DC

## 2018-06-12 NOTE — Progress Notes (Signed)
Subjective:  Patient ID: Michelle Kirby, female    DOB: 10/07/1950  Age: 67 y.o. MRN: [id number]  CC: Hypertension; Hyperlipidemia; and Annual Exam   HPI Michelle DallasJoan D Burgener presents for a CPX.  She tells me her blood pressure has been well controlled.  She is very active and denies any recent episodes of CP, DOE, palpitations, edema, or fatigue.  She complains of chronic constipation related to her opioid therapy but she gets symptom relief with over-the-counter the options such as MiraLAX.  She complains of spontaneous bruises on both forearms for the last few years.  She has had to stop taking a baby aspirin every day.  Past Medical History:  Diagnosis Date  . Depression   . Hyperlipidemia   . Hypertension   . LBP (low back pain)    Past Surgical History:  Procedure Laterality Date  . BREAST BIOPSY    . LUMBAR FUSION  1997    reports that she has quit smoking. She has never used smokeless tobacco. She reports that she does not drink alcohol or use drugs. family history includes Cancer in her brother and mother; Heart disease in her father; Hypertension in her brother, brother, father, sister, and sister. Allergies  Allergen Reactions  . Micardis [Telmisartan]     01/07/15 facial rash    Outpatient Medications Prior to Visit  Medication Sig Dispense Refill  . amLODipine (NORVASC) 5 MG tablet Take 1 tablet (5 mg total) by mouth daily. 90 tablet 0  . Morphine Sulfate ER 15 MG TBEA Take by mouth.    . norethindrone-ethinyl estradiol (FEMHRT 1/5) 1-5 MG-MCG TABS tablet Take by mouth daily.    Marland Kitchen. oxyCODONE-acetaminophen (PERCOCET) 10-325 MG tablet Take 1 tablet by mouth 2 (two) times daily as needed.    . pregabalin (LYRICA) 300 MG capsule Take 300 mg by mouth 2 (two) times daily.    Marland Kitchen. triamcinolone cream (KENALOG) 0.1 % Apply 1 application topically 2 (two) times daily. 30 g 0  . traZODone (DESYREL) 100 MG tablet TAKE UP TO 3 TABLET BY MOUTH AT BEDTIME  6   No  facility-administered medications prior to visit.     ROS Review of Systems  Constitutional: Negative for diaphoresis and fatigue.  HENT: Negative.  Negative for nosebleeds and trouble swallowing.   Eyes: Negative for visual disturbance.  Respiratory: Negative for cough, chest tightness, shortness of breath and wheezing.   Gastrointestinal: Positive for constipation. Negative for abdominal pain, diarrhea, nausea and vomiting.  Endocrine: Negative.   Genitourinary: Negative.  Negative for difficulty urinating, dysuria, hematuria, vaginal bleeding and vaginal discharge.  Musculoskeletal: Negative.   Skin: Negative.  Negative for color change, rash and wound.  Neurological: Negative.  Negative for dizziness, weakness, light-headedness and numbness.  Hematological: Negative for adenopathy. Bruises/bleeds easily.  Psychiatric/Behavioral: Positive for sleep disturbance. Negative for dysphoric mood. The patient is not nervous/anxious.     Objective:  BP 128/72 (BP Location: Left Arm, Patient Position: Sitting, Cuff Size: Normal)   Pulse 62   Temp 97.9 F (36.6 C) (Oral)   Ht 5' 3.5" (1.613 m)   Wt 131 lb 8 oz (59.6 kg)   SpO2 98%   BMI 22.93 kg/m   BP Readings from Last 3 Encounters:  06/12/18 128/72  04/05/18 124/76  03/08/16 122/68    Wt Readings from Last 3 Encounters:  06/12/18 131 lb 8 oz (59.6 kg)  04/05/18 134 lb 1.9 oz (60.8 kg)  03/08/16 138 lb (62.6 kg)  Physical Exam  Constitutional: She is oriented to person, place, and time. No distress.  HENT:  Mouth/Throat: Oropharynx is clear and moist. No oropharyngeal exudate.  Eyes: Conjunctivae are normal. No scleral icterus.  Neck: Normal range of motion. Neck supple. No JVD present. No thyromegaly present.  Cardiovascular: Normal rate, regular rhythm and normal heart sounds. Exam reveals no gallop.  No murmur heard. Pulmonary/Chest: Effort normal and breath sounds normal. No respiratory distress. She has no wheezes.  She has no rhonchi. She has no rales.  Abdominal: Soft. Bowel sounds are normal. She exhibits no mass. There is no hepatosplenomegaly. There is no tenderness.  Musculoskeletal: Normal range of motion. She exhibits no edema, tenderness or deformity.  Lymphadenopathy:    She has no cervical adenopathy.  Neurological: She is alert and oriented to person, place, and time.  Skin: Skin is warm and dry. She is not diaphoretic. No pallor.  There are medium sized ecchymoses located on the dorsal surfaces of both forearms.  They measure about 0.5 to 1 cm each.  Psychiatric: She has a normal mood and affect. Her behavior is normal. Judgment and thought content normal.  Vitals reviewed.   Lab Results  Component Value Date   WBC 7.4 06/12/2018   HGB 13.5 06/12/2018   HCT 40.0 06/12/2018   PLT 290.0 06/12/2018   GLUCOSE 97 06/12/2018   CHOL 178 06/12/2018   TRIG 190.0 (H) 06/12/2018   HDL 58.00 06/12/2018   LDLDIRECT 159.8 05/23/2013   LDLCALC 82 06/12/2018   ALT 17 06/12/2018   AST 16 06/12/2018   NA 140 06/12/2018   K 3.9 06/12/2018   CL 107 06/12/2018   CREATININE 1.02 06/12/2018   BUN 16 06/12/2018   CO2 28 06/12/2018   TSH 0.87 06/12/2018   INR 1.0 06/12/2018   HGBA1C 5.4 02/23/2011    Dg Sinus 1-2 Views  Result Date: 02/23/2014 CLINICAL DATA:  Frontal headache and sore throat.  Fever. EXAM: PARANASAL SINUSES - 1-2 VIEW COMPARISON:  Radiographs dated 04/17/2011 FINDINGS: The paranasal sinus are aerated. There is no evidence of sinus opacification air-fluid levels or mucosal thickening. No significant bone abnormalities are seen. IMPRESSION: Normal sinuses. Electronically Signed   By: Geanie Cooley M.D.   On: 02/23/2014 12:51    Assessment & Plan:   Madelena was seen today for hypertension, hyperlipidemia and annual exam.  Diagnoses and all orders for this visit:  Essential hypertension, benign- Her blood pressure is adequately well controlled.  Will continue the current dose of  amlodipine. -     Comprehensive metabolic panel; Future -     TSH; Future  Hyperlipidemia LDL goal <130- Her ASCVD risk score is not elevated so I do not recommend a statin for CV risk reduction. -     Lipid panel; Future -     Comprehensive metabolic panel; Future -     TSH; Future  Visit for screening mammogram -     Cancel: MM DIGITAL SCREENING BILATERAL; Future  Routine general medical examination at a health care facility  Purpura [facility])- Her platelet count, PT, and PTT are all normal.  Will screen for hep C.  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  I have offered her reassurance about this. -     CBC with Differential/Platelet; Future -     Comprehensive metabolic panel; Future -     Hepatitis C antibody; Future -     Protime-INR; Future -     APTT; Future  Need for hepatitis  C screening test -     Hepatitis C antibody; Future  Psychophysiological insomnia -     traZODone (DESYREL) 50 MG tablet; Take 1 tablet (50 mg total) by mouth at bedtime.  Need for pneumococcal vaccination -     Pneumococcal conjugate vaccine 13-valent   I have discontinued Orene Desanctis. Savoia's traZODone. I am also having her start on traZODone. Additionally, I am having her maintain her pregabalin, triamcinolone cream, Morphine Sulfate ER, oxyCODONE-acetaminophen, norethindrone-ethinyl estradiol, and amLODipine.  Meds ordered this encounter  Medications  . traZODone (DESYREL) 50 MG tablet    Sig: Take 1 tablet (50 mg total) by mouth at bedtime.    Dispense:  90 tablet    Refill:  1   See AVS for instructions about healthy living and anticipatory guidance.  Follow-up: Return in about 6 months (around 12/12/2018).  Sanda Linger, MD

## 2018-06-12 NOTE — Patient Instructions (Signed)

## 2018-06-13 ENCOUNTER — Encounter: Payer: Self-pay | Admitting: Internal Medicine

## 2018-06-13 ENCOUNTER — Ambulatory Visit: Payer: Medicare Other | Admitting: Internal Medicine

## 2018-06-13 LAB — HEPATITIS C ANTIBODY
Hepatitis C Ab: NONREACTIVE
SIGNAL TO CUT-OFF: 0.04 (ref <1.00)

## 2018-06-13 NOTE — Assessment & Plan Note (Signed)

## 2018-06-25 ENCOUNTER — Ambulatory Visit
Admission: RE | Admit: 2018-06-25 | Discharge: 2018-06-25 | Disposition: A | Discharge status: Home / Self Care | Payer: Medicare Other | Source: Ambulatory Visit | Attending: Pain Medicine | Admitting: Pain Medicine

## 2018-06-25 DIAGNOSIS — R202 Paresthesia of skin: Principal | ICD-10-CM

## 2018-06-25 DIAGNOSIS — R200 Anesthesia of skin: Secondary | ICD-10-CM

## 2018-07-06 ENCOUNTER — Other Ambulatory Visit: Payer: Self-pay | Admitting: Family

## 2018-07-08 ENCOUNTER — Other Ambulatory Visit: Payer: Self-pay | Admitting: Internal Medicine

## 2018-07-08 MED ORDER — AMLODIPINE BESYLATE 5 MG PO TABS: 5.0000 mg | ORAL_TABLET | Freq: Every day | ORAL | 0 refills | Status: DC

## 2018-07-08 NOTE — Telephone Encounter (Signed)
Per OV on 06-12-18 with Dr. Yetta BarreJones, continue current dose of amlodipine / Refilled per protocol /

## 2018-07-08 NOTE — Telephone Encounter (Signed)
Copied from CRM [id number]. Topic: Quick Communication - Rx Refill/Question >> Jul 08, 2018  7:58 AM Gerrianne ScalePayne, Makari Portman L wrote: Medication: amLODipine (NORVASC) 5 MG tablet  Has the patient contacted their pharmacy? Yes.  Provider didn't refill at the time of visit (Agent: If no, request that the patient contact the pharmacy for the refill.) (Agent: If yes, when and what did the pharmacy advise?) to call provider  Preferred Pharmacy (with phone number or street name):     [id number] [place], [place] - [address] [phone] (Phone) [phone] (Fax)    Agent: Please be advised that RX refills may take up to 3 business days. We ask that you follow-up with your pharmacy.

## 2018-09-26 ENCOUNTER — Telehealth: Payer: Self-pay | Admitting: *Deleted

## 2018-09-26 ENCOUNTER — Encounter: Payer: Self-pay | Admitting: Internal Medicine

## 2018-09-26 ENCOUNTER — Other Ambulatory Visit: Payer: Self-pay

## 2018-09-26 ENCOUNTER — Ambulatory Visit (INDEPENDENT_AMBULATORY_CARE_PROVIDER_SITE_OTHER): Payer: Medicare Other | Admitting: Internal Medicine

## 2018-09-26 VITALS — BP 108/66 | HR 62 | Temp 98.4°F | Ht 63.5 in | Wt 127.0 lb

## 2018-09-26 DIAGNOSIS — D692 Other nonthrombocytopenic purpura: Secondary | ICD-10-CM | POA: Diagnosis not present

## 2018-09-26 DIAGNOSIS — I10 Essential (primary) hypertension: Secondary | ICD-10-CM

## 2018-09-26 DIAGNOSIS — J069 Acute upper respiratory infection, unspecified: Secondary | ICD-10-CM | POA: Diagnosis not present

## 2018-09-26 MED ORDER — HYDROCODONE-HOMATROPINE 5-1.5 MG/5ML PO SYRP: 5.0000 mL | ORAL_SOLUTION | Freq: Four times a day (QID) | ORAL | 0 refills | Status: AC | PRN

## 2018-09-26 MED ORDER — AZITHROMYCIN 250 MG PO TABS: ORAL_TABLET | ORAL | 1 refills | Status: AC

## 2018-09-26 NOTE — Assessment & Plan Note (Signed)
Mild to mod, for antibx course,  to f/u any worsening symptoms or concerns 

## 2018-09-26 NOTE — Assessment & Plan Note (Signed)
stable overall by history and exam, recent data reviewed with pt, and pt to continue medical treatment as before,  to f/u any worsening symptoms or concerns  

## 2018-09-26 NOTE — Patient Instructions (Signed)
Please take all new medication as prescribed - the antibiotic, and cough medicine as needed  Please continue all other medications as before, and refills have been done if requested.  Please have the pharmacy call with any other refills you may need.  Please keep your appointments with your specialists as you may have planned   

## 2018-09-26 NOTE — Telephone Encounter (Signed)
Called and spoke to patient to screen for covid-19 before her scheduled appointment today. Patient has not been feeling well since Monday. She c/o a sore throat, she is feverish and does not have a thermometer to check her temp but does not feel that it has been very high. She states she feels "flu like" Denies cough, SOB, nausea, vomiting or diarrhea. Nurse discussed symptoms with provider who states she will be fine to come to scheduled visit.

## 2018-09-26 NOTE — Progress Notes (Signed)
Subjective:    Patient ID: Michelle Kirby, female    DOB: July 29, 1950, 68 y.o.   MRN: [id number]  HPI   Here with 2-3 days acute onset fever, severe ST pain, pressure, headache, general weakness and malaise, and non prod cough, but pt denies chest pain, wheezing, increased sob or doe, orthopnea, PND, increased LE swelling, palpitations, dizziness or syncope.  Pt denies new neurological symptoms such as new headache, or facial or extremity weakness or numbness   Pt denies polydipsia, polyuria.  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 Depression   . Hyperlipidemia   . Hypertension   . LBP (low back pain)    Past Surgical History:  Procedure Laterality Date  . BREAST BIOPSY    . LUMBAR FUSION  1997    reports that she has quit smoking. She has never used smokeless tobacco. She reports that she does not drink alcohol or use drugs. family history includes Cancer in her brother and mother; Heart disease in her father; Hypertension in her brother, brother, father, sister, and sister. Allergies  Allergen Reactions  . Micardis [Telmisartan]     01/07/15 facial rash   Current Outpatient Medications on File Prior to Visit  Medication Sig Dispense Refill  . amLODipine (NORVASC) 5 MG tablet TAKE 1 TABLET BY MOUTH EVERY DAY 90 tablet 1  . amLODipine (NORVASC) 5 MG tablet Take 1 tablet (5 mg total) by mouth daily. 90 tablet 0  . Morphine Sulfate ER 15 MG TBEA Take by mouth.    . norethindrone-ethinyl estradiol (FEMHRT 1/5) 1-5 MG-MCG TABS tablet Take by mouth daily.    Marland Kitchen oxyCODONE-acetaminophen (PERCOCET) 10-325 MG tablet Take 1 tablet by mouth 2 (two) times daily as needed.    . pregabalin (LYRICA) 300 MG capsule Take 300 mg by mouth 2 (two) times daily.    . traZODone (DESYREL) 50 MG tablet Take 1 tablet (50 mg total) by mouth at bedtime. 90 tablet 1  . triamcinolone cream (KENALOG)  0.1 % Apply 1 application topically 2 (two) times daily. 30 g 0   No current facility-administered medications on file prior to visit.    Review of Systems  Constitutional: Negative for other unusual diaphoresis or sweats HENT: Negative for ear discharge or swelling Eyes: Negative for other worsening visual disturbances Respiratory: Negative for stridor or other swelling  Gastrointestinal: Negative for worsening distension or other blood Genitourinary: Negative for retention or other urinary change Musculoskeletal: Negative for other MSK pain or swelling Skin: Negative for color change or other new lesions Neurological: Negative for worsening tremors and other numbness  Psychiatric/Behavioral: Negative for worsening agitation or other fatigue All other system neg per pt    Objective:   Physical Exam BP 108/66   Pulse 62   Temp 98.4 F (36.9 C) (Oral)   Ht 5' 3.5" (1.613 m)   Wt 127 lb (57.6 kg)   SpO2 96%   BMI 22.14 kg/m  VS noted, mild ill Constitutional: Pt appears in NAD HENT: Head: NCAT.  Right Ear: External ear normal.  Left Ear: External ear normal. Bilat tm's with mild erythema.  Max sinus areas mod tender.  Pharynx with severe erythema, mild exudate, with bilat submandib tender LA   Eyes: . Pupils are equal, round, and reactive to light. Conjunctivae and EOM are normal Nose: without d/c or deformity Neck: Neck supple. Gross normal  ROM Cardiovascular: Normal rate and regular rhythm.   Pulmonary/Chest: Effort normal and breath sounds without rales or wheezing.  Neurological: Pt is alert. At baseline orientation, motor grossly intact Skin: Skin is warm. No rashes, other new lesions, no LE edema Psychiatric: Pt behavior is normal without agitation  No other exam findings Lab Results  Component Value Date   WBC 7.4 06/12/2018   HGB 13.5 06/12/2018   HCT 40.0 06/12/2018   PLT 290.0 06/12/2018   GLUCOSE 97 06/12/2018   CHOL 178 06/12/2018   TRIG 190.0 (H)  06/12/2018   HDL 58.00 06/12/2018   LDLDIRECT 159.8 05/23/2013   LDLCALC 82 06/12/2018   ALT 17 06/12/2018   AST 16 06/12/2018   NA 140 06/12/2018   K 3.9 06/12/2018   CL 107 06/12/2018   CREATININE 1.02 06/12/2018   BUN 16 06/12/2018   CO2 28 06/12/2018   TSH 0.87 06/12/2018   INR 1.0 06/12/2018   HGBA1C 5.4 02/23/2011       Assessment & Plan:

## 2018-09-30 ENCOUNTER — Other Ambulatory Visit: Payer: Self-pay | Admitting: Internal Medicine

## 2018-10-02 ENCOUNTER — Telehealth: Payer: Self-pay | Admitting: Internal Medicine

## 2018-10-02 MED ORDER — LEVOFLOXACIN 250 MG PO TABS: 250.0000 mg | ORAL_TABLET | Freq: Every day | ORAL | 0 refills | Status: AC

## 2018-10-02 NOTE — Telephone Encounter (Signed)
Routing to dr Jonny Ruiz, patient was seen by you, diagnosed with URI, given Zpak,----please advise, I will call patient back, thanks

## 2018-10-02 NOTE — Telephone Encounter (Signed)
Copied from CRM [id number]. Topic: Quick Communication - See Telephone Encounter >> Oct 02, 2018 10:44 AM Jolayne Haines L wrote: CRM for notification. See Telephone encounter for: 10/02/18. Patient states she was seen on 3/19 - not improving. She is still lethargic , low grade fever off and on, sore throat and headache. She said this is going on over one week and has been out of work. Reports no Shortness of Breath. She has finished all medications given. Please Advise.

## 2018-10-02 NOTE — Telephone Encounter (Signed)
Ok for levaquin asd - done erx °

## 2018-10-02 NOTE — Telephone Encounter (Signed)
Patient advised rx has been sent in to her pharm---patient encouraged to stay at home and have someone else pick rx up at pharmacy to avoid any unnecessary contact/exposures

## 2018-12-01 ENCOUNTER — Other Ambulatory Visit: Payer: Self-pay | Admitting: Internal Medicine

## 2018-12-01 DIAGNOSIS — F5104 Psychophysiologic insomnia: Secondary | ICD-10-CM

## 2018-12-24 ENCOUNTER — Other Ambulatory Visit: Payer: Self-pay | Admitting: Internal Medicine
